# Patient Record
Sex: Male | Born: 1985 | Race: Black or African American | Hispanic: No | Marital: Married | Smoking: Current some day smoker
Health system: Southern US, Community
[De-identification: ages and names within clinical notes are randomized; demographics above are authoritative.]

## PROBLEM LIST (undated history)

## (undated) DIAGNOSIS — S62109A Fracture of unspecified carpal bone, unspecified wrist, initial encounter for closed fracture: Secondary | ICD-10-CM

## (undated) DIAGNOSIS — F1010 Alcohol abuse, uncomplicated: Secondary | ICD-10-CM

---

## 2011-05-30 ENCOUNTER — Emergency Department: Payer: Self-pay | Admitting: Emergency Medicine

## 2012-08-21 ENCOUNTER — Emergency Department: Payer: Self-pay | Admitting: Emergency Medicine

## 2012-08-21 LAB — COMPREHENSIVE METABOLIC PANEL
Albumin: 4.4 g/dL (ref 3.4–5.0)
Alkaline Phosphatase: 66 U/L (ref 50–136)
Anion Gap: 7 (ref 7–16)
BUN: 7 mg/dL (ref 7–18)
Bilirubin,Total: 0.4 mg/dL (ref 0.2–1.0)
Co2: 24 mmol/L (ref 21–32)
Creatinine: 0.9 mg/dL (ref 0.60–1.30)
EGFR (African American): >60
EGFR (Non-African Amer.): >60
Glucose: 101 mg/dL — ABNORMAL HIGH (ref 65–99)
Osmolality: 274 (ref 275–301)
Potassium: 3.7 mmol/L (ref 3.5–5.1)
SGOT(AST): 42 U/L — ABNORMAL HIGH (ref 15–37)
SGPT (ALT): 28 U/L (ref 12–78)
Total Protein: 8.2 g/dL (ref 6.4–8.2)

## 2012-08-21 LAB — LIPASE, BLOOD: Lipase: 627 U/L — ABNORMAL HIGH (ref 73–393)

## 2012-08-21 LAB — ETHANOL: Ethanol: 193 mg/dL

## 2012-08-21 LAB — URINALYSIS, COMPLETE
Bacteria: NONE SEEN
Bilirubin,UR: NEGATIVE
Glucose,UR: NEGATIVE mg/dL (ref 0–75)
Ketone: NEGATIVE
Leukocyte Esterase: NEGATIVE
Ph: 6 (ref 4.5–8.0)
RBC,UR: NONE SEEN /HPF (ref 0–5)
WBC UR: NONE SEEN /HPF (ref 0–5)

## 2012-08-21 LAB — CBC
MCH: 30.1 pg (ref 26.0–34.0)
Platelet: 237 10*3/uL (ref 150–440)
RBC: 4.92 10*6/uL (ref 4.40–5.90)

## 2012-08-27 ENCOUNTER — Emergency Department: Payer: Self-pay | Admitting: Internal Medicine

## 2012-08-27 LAB — URINALYSIS, COMPLETE
Bilirubin,UR: NEGATIVE
Ketone: NEGATIVE
Ph: 6 (ref 4.5–8.0)
Specific Gravity: 1.008 (ref 1.003–1.030)
Squamous Epithelial: 1
WBC UR: <1 /HPF (ref 0–5)

## 2012-08-27 LAB — COMPREHENSIVE METABOLIC PANEL
Albumin: 4.5 g/dL (ref 3.4–5.0)
Anion Gap: 10 (ref 7–16)
BUN: 7 mg/dL (ref 7–18)
Calcium, Total: 9 mg/dL (ref 8.5–10.1)
Chloride: 105 mmol/L (ref 98–107)
EGFR (Non-African Amer.): >60
Glucose: 82 mg/dL (ref 65–99)
Osmolality: 273 (ref 275–301)
Potassium: 4 mmol/L (ref 3.5–5.1)
SGOT(AST): 53 U/L — ABNORMAL HIGH (ref 15–37)
Total Protein: 8.4 g/dL — ABNORMAL HIGH (ref 6.4–8.2)

## 2012-08-27 LAB — CBC
HGB: 14.8 g/dL (ref 13.0–18.0)
MCHC: 34.1 g/dL (ref 32.0–36.0)

## 2012-08-27 LAB — ETHANOL
Ethanol %: 0.151 % — ABNORMAL HIGH (ref 0.000–0.080)
Ethanol: 151 mg/dL

## 2012-08-27 LAB — LIPASE, BLOOD: Lipase: 107 U/L (ref 73–393)

## 2014-09-16 ENCOUNTER — Emergency Department (HOSPITAL_COMMUNITY): Payer: Self-pay

## 2014-09-16 ENCOUNTER — Emergency Department (HOSPITAL_COMMUNITY)
Admission: EM | Admit: 2014-09-16 | Discharge: 2014-09-16 | Disposition: A | Discharge status: Home / Self Care | Payer: Self-pay | Attending: Emergency Medicine | Admitting: Emergency Medicine

## 2014-09-16 ENCOUNTER — Encounter (HOSPITAL_COMMUNITY): Payer: Self-pay | Admitting: Emergency Medicine

## 2014-09-16 DIAGNOSIS — F1010 Alcohol abuse, uncomplicated: Secondary | ICD-10-CM | POA: Insufficient documentation

## 2014-09-16 DIAGNOSIS — R569 Unspecified convulsions: Secondary | ICD-10-CM | POA: Insufficient documentation

## 2014-09-16 LAB — COMPREHENSIVE METABOLIC PANEL
ALBUMIN: 4 g/dL (ref 3.5–5.0)
ALK PHOS: 56 U/L (ref 38–126)
ALT: 55 U/L (ref 17–63)
AST: 83 U/L — ABNORMAL HIGH (ref 15–41)
Anion gap: 16 — ABNORMAL HIGH (ref 5–15)
BILIRUBIN TOTAL: 0.8 mg/dL (ref 0.3–1.2)
BUN: 9 mg/dL (ref 6–20)
CHLORIDE: 95 mmol/L — AB (ref 101–111)
CO2: 19 mmol/L — ABNORMAL LOW (ref 22–32)
CREATININE: 1.08 mg/dL (ref 0.61–1.24)
Calcium: 8.9 mg/dL (ref 8.9–10.3)
GFR calc non Af Amer: >60 mL/min (ref >60)
Glucose, Bld: 81 mg/dL (ref 65–99)
POTASSIUM: 4.2 mmol/L (ref 3.5–5.1)
Sodium: 130 mmol/L — ABNORMAL LOW (ref 135–145)
TOTAL PROTEIN: 7.2 g/dL (ref 6.5–8.1)

## 2014-09-16 LAB — CBC WITH DIFFERENTIAL/PLATELET
Basophils Absolute: 0 10*3/uL (ref 0.0–0.1)
Basophils Relative: 0 % (ref 0–1)
Eosinophils Absolute: 0 10*3/uL (ref 0.0–0.7)
Eosinophils Relative: 0 % (ref 0–5)
HCT: 39.8 % (ref 39.0–52.0)
Hemoglobin: 13.7 g/dL (ref 13.0–17.0)
LYMPHS PCT: 13 % (ref 12–46)
Lymphs Abs: 0.8 10*3/uL (ref 0.7–4.0)
MCH: 29.5 pg (ref 26.0–34.0)
MCHC: 34.4 g/dL (ref 30.0–36.0)
MCV: 85.6 fL (ref 78.0–100.0)
Monocytes Absolute: 0.5 10*3/uL (ref 0.1–1.0)
Monocytes Relative: 9 % (ref 3–12)
NEUTROS ABS: 4.7 10*3/uL (ref 1.7–7.7)
Neutrophils Relative %: 78 % — ABNORMAL HIGH (ref 43–77)
PLATELETS: 188 10*3/uL (ref 150–400)
RBC: 4.65 MIL/uL (ref 4.22–5.81)
RDW: 15.1 % (ref 11.5–15.5)
WBC: 6.1 10*3/uL (ref 4.0–10.5)

## 2014-09-16 LAB — ETHANOL: Alcohol, Ethyl (B): <5 mg/dL (ref <5)

## 2014-09-16 NOTE — ED Provider Notes (Signed)
Patient's head CT was negative for acute pathology. Speaking with the patient he states he's been drinking heavily daily for over a year but does not want to stop drinking. Did discuss with him that because he did not drink yesterday or today the seizure may have been precipitated by that. However patient states that he plans to drink when he leaves in his not interested in stopping. However he was given resources for alcohol detox and AA. Also patient given neurology follow-up.  He was unable to urinate here however states he smokes marijuana occasionally but does not use any other street drugs and does not take benzodiazepines.  Gwyneth SproutWhitney Avraham Benish, MD 09/16/14 1655

## 2014-09-16 NOTE — Discharge Instructions (Signed)
Seizure, Adult °A seizure is abnormal electrical activity in the brain. Seizures usually last from 30 seconds to 2 minutes. There are various types of seizures. °Before a seizure, you may have a warning sensation (aura) that a seizure is about to occur. An aura may include the following symptoms:  °· Fear or anxiety. °· Nausea. °· Feeling like the room is spinning (vertigo). °· Vision changes, such as seeing flashing lights or spots. °Common symptoms during a seizure include: °· A change in attention or behavior (altered mental status). °· Convulsions with rhythmic jerking movements. °· Drooling. °· Rapid eye movements. °· Grunting. °· Loss of bladder and bowel control. °· Bitter taste in the mouth. °· Tongue biting. °After a seizure, you may feel confused and sleepy. You may also have an injury resulting from convulsions during the seizure. °HOME CARE INSTRUCTIONS  °· If you are given medicines, take them exactly as prescribed by your health care provider. °· Keep all follow-up appointments as directed by your health care provider. °· Do not swim or drive or engage in risky activity during which a seizure could cause further injury to you or others until your health care provider says it is OK. °· Get adequate rest. °· Teach friends and family what to do if you have a seizure. They should: °¨ Lay you on the ground to prevent a fall. °¨ Put a cushion under your head. °¨ Loosen any tight clothing around your neck. °¨ Turn you on your side. If vomiting occurs, this helps keep your airway clear. °¨ Stay with you until you recover. °¨ Know whether or not you need emergency care. °SEEK IMMEDIATE MEDICAL CARE IF: °· The seizure lasts longer than 5 minutes. °· The seizure is severe or you do not wake up immediately after the seizure. °· You have an altered mental status after the seizure. °· You are having more frequent or worsening seizures. °Someone should drive you to the emergency department or call local emergency  services (911 in [place] °MAKE SURE YOU: °· Understand these instructions. °· Will watch your condition. °· Will get help right away if you are not doing well or get worse. °Document Released: 04/13/2000 Document Revised: 02/04/2013 Document Reviewed: 11/26/2012 °ExitCare® Patient Information ©2015 [employer]. This information is not intended to replace advice given to you by your health care provider. Make sure you discuss any questions you have with your health care provider. ° °Driving and Equipment Restrictions °Some medical problems make it dangerous to drive, ride a bike, or use machines. Some of these problems are: °· A hard blow to the head (concussion). °· Passing out (fainting). °· Twitching and shaking (seizures). °· Low blood sugar. °· Taking medicine to help you relax (sedatives). °· Taking pain medicines. °· Wearing an eye patch. °· Wearing splints. This can make it hard to use parts of your body that you need to drive safely. °HOME CARE  °· Do not drive until your doctor says it is okay. °· Do not use machines until your doctor says it is okay. °You may need a form signed by your doctor (medical release) before you can drive again. You may also need this form before you do other tasks where you need to be fully alert. °MAKE SURE YOU: °· Understand these instructions. °· Will watch your condition. °· Will get help right away if you are not doing well or get worse. °Document Released: 05/24/2004 Document Revised: 07/09/2011 Document Reviewed: 08/24/2009 °ExitCare® Patient Information ©2015 [employer].   This information is not intended to replace advice given to you by your health care provider. Make sure you discuss any questions you have with your health care provider. ° ° °Emergency Department Resource Guide °1) Find a Doctor and Pay Out of Pocket °Although you won't have to find out who is covered by your insurance plan, it is a good idea to ask around and get recommendations. You will then need  to call the office and see if the doctor you have chosen will accept you as a new patient and what types of options they offer for patients who are self-pay. Some doctors offer discounts or will set up payment plans for their patients who do not have insurance, but you will need to ask so you aren't surprised when you get to your appointment. ° °2) Contact Your Local Health Department °Not all health departments have doctors that can see patients for sick visits, but many do, so it is worth a call to see if yours does. If you don't know where your local health department is, you can check in your phone book. The CDC also has a tool to help you locate your state's health department, and many state websites also have listings of all of their local health departments. ° °3) Find a Walk-in Clinic °If your illness is not likely to be very severe or complicated, you may want to try a walk in clinic. These are popping up all over the country in pharmacies, drugstores, and shopping centers. They're usually staffed by nurse practitioners or physician assistants that have been trained to treat common illnesses and complaints. They're usually fairly quick and inexpensive. However, if you have serious medical issues or chronic medical problems, these are probably not your best option. ° °No Primary Care Doctor: °- Call [facility] at  832-8000 - they can help you locate a primary care doctor that  accepts your insurance, provides certain services, etc. °- Physician Referral Service- 1-800-533-3463 ° °Chronic Pain Problems: °Organization         Address  Phone   Notes  °[facility]  (336) 297-2271 Patients need to be referred by their primary care doctor.  ° °Medication Assistance: °Organization         Address  Phone   Notes  °[place] [facility] [address] °[place], [place] [address] (336) 641-8030 --Must be a resident of [place] °-- Must have NO insurance  coverage whatsoever (no Medicaid/ Medicare, etc.) °-- The pt. MUST have a primary care doctor that directs their care regularly and follows them in the community °  °[facility]  (866) 331-1348   °[employer]  (888) 892-1162   ° °Agencies that provide inexpensive medical care: °Organization         Address  Phone   Notes  °[facility]  (336) 832-8035   °[facility]    (336) 832-7272   °[facility] [address] °[place], [place] [address] (336) 832-4777   °[facility] [address], °[place] (336) 271-4999   °[facility]    (336) 373-0678   °[facility]    (336) 272-1050   °[facility] ° [address], [place] Phone:  (336) 832-4444, Fax:  (336) 832-4440 Hours of Operation:  9 am - 6 pm, M-F.  Also accepts Medicaid/Medicare and self-pay.  °[facility] ° [address], [place] Phone: (336) 832-3150, Fax: (336) 832-3151. Hours of Operation:  8:30 am - 5:30 pm, M-F.  Also accepts Medicaid and self-pay.  °[facility] [place] [address], [place] Phone: (336) 878-6027   °[facility] [address], [place], [place] (336)723-1848, Ext. 123 Mondays & Thursdays: 7-9 AM.  First 15 patients are seen on a first come, first serve basis. °  ° °Medicaid-accepting [place] Providers: ° °Organization         Address  Phone   Notes  °[facility] [address], [place] (336) 641-2100 Also accepts self-pay patients.  °[facility] [address], [place] ° (336) 856-9996   °[facility] [address], [place] (336) 288-8857   °[facility] [address] [place] [address], [place] (336) 299-7000   °Veita Bland [address], [place]  ° (336) 373-1557 Only accepts [place] Access Medicaid patients after they have their  name applied to their card.  ° °Self-Pay (no insurance) in [place]: ° °Organization         Address  Phone   Notes  °Sickle Cell Patients, [facility] [address], [place] (336) 832-1970   °[facility] [address], [place] (336) 832-4400   °[facility] ° [address], [place] (336) 992-4800   °[facility]/Dr. Osei-Bonsu ° [address] [place] [address], [place] or [address], [place] (336) 841-8500 Phone number for both [place] and [place] locations is the same.  °[facility] [address], [place] (336) 299-0000   °[facility] [address] [place] [address], [place] or [address] (336) 852-7530 °(336) 878-2260   °[facility] [address], [place] (336) 350-1642, phone; (336) 294-5005, fax Sees patients 1st and 3rd Saturday of every month.  Must not qualify for public or private insurance (i.e. Medicaid, Medicare, [place] Health Choice, Veterans' Benefits) • Household income should be no more than 200% of the poverty level •The clinic cannot treat you if you are pregnant or think you are pregnant • Sexually transmitted diseases are not treated at the clinic.  ° ° °Dental Care: °Organization         Address  Phone  Notes  °[facility] [address], [place] (336) 641-6152 Accepts children up to age 21 who are enrolled in Medicaid or [place] Health Choice; pregnant women with a Medicaid card; and children who have applied for Medicaid or [place] Health Choice, but were declined, whose parents can pay a reduced fee at time of service.  °[facility] [place]  [address], [place] (336) 641-7733 Accepts children up to age 21 who are enrolled in Medicaid or [place] Health Choice; pregnant women with a Medicaid card; and children who have applied for  Medicaid or [place] Health Choice, but were declined, whose parents can pay a reduced fee at time of service.  °[facility] ° [address], [place] (336) 641-4533 Patients are seen by appointment only. Walk-ins are not accepted. [facility] will see patients 18 years of age and older. °Monday - Tuesday (8am-5pm) °Most Wednesdays (8:30-5pm) °$30 per visit, cash only  °[facility] ° [address], [place] (336) 641-4533 Patients are seen by appointment only. Walk-ins are not accepted. [facility] will see patients 18 years of age and older. °One Wednesday Evening (Monthly: Volunteer Based).  $30 per visit, cash only  °[facility]  (919) 537-3737 for adults; Children under age 4, call Graduate Pediatric Dentistry at (919) [phone]. Children aged 4-14, please call (919) [phone] to request a pediatric application. ° Dental services are provided in all areas of dental care including fillings, crowns and bridges, complete and partial dentures, implants, gum treatment, root canals, and extractions. Preventive care is also provided. Treatment is provided to both adults and children. °Patients are selected via a lottery and there is often a waiting list. °  °[facility] [address], °[place] ° (336) 763-8833 [email or web address] °  °[facility] [address], [place], [place] (336)723-1848, Ext. 123 Second and Fourth Thursday of each month, opens at 6:30 AM; Clinic ends at 9 AM.  Patients are seen on a first-come first-served basis, and a limited number are seen during each clinic.  ° °[facility] ° [address], [place], [place] (336) 723-7904   Eligibility Requirements °You must have lived in [place], [place], or [place] counties for at least the last three months. °  You cannot be eligible for state or federal sponsored healthcare insurance, including Veterans Administration, Medicaid,  or Medicare. °  You generally cannot be eligible for healthcare insurance through your employer.  °  How to apply: °Eligibility screenings are held every Tuesday and Wednesday afternoon from 1:00 pm until 4:00 pm. You do not need an appointment for the interview!  °[facility] [address], [place], [place] 336-631-2330   °[facility]  336-342-8273   °[facility]  336-703-3100   °[facility]  336-570-6415   ° °Behavioral Health Resources in the Community: °Intensive Outpatient Programs °Organization         Address  Phone  Notes  °[facility] [address], [place], [place] 336-878-6098   °[facility] [address], [place], [place] 336-832-9800   °[facility] [address], [place], [place] ° 336-882-2125   °[facility] [address],  °[place], [place] 1-800-853-5163 or 336-641-4981   °Substance Abuse Resources °Organization         Address  Phone  Notes  °[facility]  336-882-2125   °[facility]  336-784-9470   °[facility]  336-285-9073   °[facility]  336-845-3988   °Residential & Outpatient Substance Abuse Program  1-800-659-3381   °Psychological Services °Organization         Address  Phone  Notes  °[facility]  336- 832-9600   °[facility]  336- 378-7881   °[facility] [address], [place] 1-800-853-5163 or 336-641-4981   ° °Mobile Crisis Teams °Organization         Address  Phone  Notes  °[facility], Mobile Crisis Care Unit  1-877-626-1772   °Assertive °[facility] ° [address] [place], [place] 336-834-9664   °Sharon DeEsch [address] °[place] 336-554-5454   ° °Self-Help/Support Groups °Organization         Address  Phone             Notes  °[facility]   [place] - variety of support groups   336- 373-1402 Call for more information  °Narcotics Anonymous (NA), [facility] [address], °[place]  2 meetings at this location  ° °Residential Treatment Programs °Organization         Address  Phone  Notes  °[facility] [address],    °[place]  1-866-801-8205   °[facility] ° [address], [place], [place] 704-293-8524   °[facility] [address], [place] 336-845-3988 Admissions: 8am-3pm M-F  °[facility] [address]    °[place], [place] 336-841-1104   °[facility] [address], [place], [place] 336-379-7146   °[facility] [address]  °[place], [place] 336-285-9073   °[facility] - Intensive Outpatient [address], [place], [place] 336-852-3033   °[facility] [address]  °[place], [place] 1-877-615-2722 or 336-784-9470   °[facility] [address] [place], [place] 336-227-7417 Accepts Medicaid  °[facility] [address]  °[place] 1-800-659-3381 Substance Abuse/Addiction Treatment  ° °[place] Behavioral Health Resources °Organization         Address  Phone  Notes  °[facility]  (888) 581-9988   °Julie Brannon, PhD [address] [place], [place]   (336) 349-5553 or (336) 951-0000   °[facility]   [address] °[place], [place] (336) 349-4454   °[facility] [address], [place], [place] (336) 342-8316 Insurance/Medicaid/sponsorship through [facility]  °[facility] [address]                                    [place], [place] (336) 342-8316 Therapy/tele-psych/case  °[facility] [address]  ° [place], [place] (336) 349-2233    °Dr. Arfeen  (336) 349-4544   °[facility] [place]  [employer] [place] [facility] 1) [address],  °2) [address], [place] °3)  [address], [place] (336) 349-3220 °(336) 342-7768 ° °(336) 342-8140   °[place] [facility] (336) 342-1394 or (336) 342-3537 (After Hours)    ° ° ° °

## 2014-09-16 NOTE — ED Provider Notes (Signed)
CSN: [id number]     Arrival date & time 09/16/14  1245 History   First MD Initiated Contact with Patient 09/16/14 1406     Chief Complaint  Patient presents with  . Seizures     (Consider location/radiation/quality/duration/timing/severity/associated sxs/prior Treatment) Patient is a 29 y.o. male presenting with seizures. The history is provided by the patient.  Seizures Seizure activity on arrival: no    patient presents with a new onset seizure. Was inside the house and then went outside to bring her shirt to one of his friends and he had a tonic-clonic seizure. Reportedly last 6 minutes. Witnessed by wife who has a seizure disorder herself. Patient was confused after. Patient is feeling better now but does ache all over. No headache. Patient does reportedly drink alcohol heavily. He did not drink yesterday. No previous history of seizures. He denies substance abuse.  No past medical history on file. No past surgical history on file. No family history on file. History  Substance Use Topics  . Smoking status: Not on file  . Smokeless tobacco: Not on file  . Alcohol Use: Not on file    Review of Systems  Constitutional: Negative for activity change and appetite change.  Eyes: Negative for pain.  Respiratory: Negative for chest tightness and shortness of breath.   Cardiovascular: Negative for chest pain and leg swelling.  Gastrointestinal: Negative for nausea, vomiting, abdominal pain and diarrhea.  Genitourinary: Negative for flank pain.  Musculoskeletal: Negative for back pain and neck stiffness.  Skin: Negative for rash.  Neurological: Positive for seizures. Negative for weakness, numbness and headaches.  Psychiatric/Behavioral: Negative for behavioral problems.      Allergies  Review of patient's allergies indicates no known allergies.  Home Medications   Prior to Admission medications   Not on File   BP 138/64 mmHg  Pulse 80  Temp(Src) 98.3 F (36.8 C) (Oral)   Resp 18  SpO2 97% Physical Exam  Constitutional: He is oriented to person, place, and time. He appears well-developed and well-nourished.  HENT:  Head: Normocephalic and atraumatic.  Eyes: Pupils are equal, round, and reactive to light.  Neck: Normal range of motion. Neck supple.  Cardiovascular: Normal rate, regular rhythm and normal heart sounds.   No murmur heard. Pulmonary/Chest: Effort normal and breath sounds normal.  Abdominal: Soft. There is no tenderness.  Musculoskeletal: Normal range of motion. He exhibits no edema.  Neurological: He is alert and oriented to person, place, and time. No cranial nerve deficit.  Skin: Skin is warm and dry.  Psychiatric: He has a normal mood and affect.  Nursing note and vitals reviewed.   ED Course  Procedures (including critical care time) Labs Review Labs Reviewed  COMPREHENSIVE METABOLIC PANEL - Abnormal; Notable for the following:    Sodium 130 (*)    Chloride 95 (*)    CO2 19 (*)    AST 83 (*)    Anion gap 16 (*)    All other components within normal limits  CBC WITH DIFFERENTIAL/PLATELET - Abnormal; Notable for the following:    Neutrophils Relative % 78 (*)    All other components within normal limits  ETHANOL  URINE RAPID DRUG SCREEN (HOSP PERFORMED)    Imaging Review Dg Chest 2 View  09/16/2014   CLINICAL DATA:  Seizures. Cough. Short of breath. Chest pain. Symptoms for 3 weeks.  EXAM: CHEST  2 VIEW  COMPARISON:  None.  FINDINGS: Cardiopericardial silhouette within normal limits. Mediastinal contours normal. Trachea midline.  No airspace disease or effusion.  IMPRESSION: No active cardiopulmonary disease.   Electronically Signed   By: Andreas NewportGeoffrey  Lamke M.D.   On: 09/16/2014 15:15     EKG Interpretation None      MDM   Final diagnoses:  Seizure    Patient with new onset seizure. Back at baseline. Some lab work still pending. Head CT pending. Will sign out to Dr. Ramond DialPlunket    Earlie Arciga, MD 09/16/14 [phone]

## 2014-09-16 NOTE — ED Notes (Signed)
Per ems, pt w/witnessed tonic/clonic seizure lasting approx per bystanders.  Pt initially post-ictal, hypertensive and tachycardic, now awake, alert, oriented w/stable vitals.  Pt w/no hx of seizures

## 2016-06-04 ENCOUNTER — Encounter (HOSPITAL_COMMUNITY): Payer: Self-pay | Admitting: Emergency Medicine

## 2016-06-04 ENCOUNTER — Emergency Department (HOSPITAL_COMMUNITY): Payer: Self-pay

## 2016-06-04 ENCOUNTER — Emergency Department (HOSPITAL_COMMUNITY)
Admission: EM | Admit: 2016-06-04 | Discharge: 2016-06-04 | Disposition: A | Discharge status: Home / Self Care | Payer: Self-pay | Attending: Emergency Medicine | Admitting: Emergency Medicine

## 2016-06-04 DIAGNOSIS — F17210 Nicotine dependence, cigarettes, uncomplicated: Secondary | ICD-10-CM | POA: Insufficient documentation

## 2016-06-04 DIAGNOSIS — Y999 Unspecified external cause status: Secondary | ICD-10-CM | POA: Insufficient documentation

## 2016-06-04 DIAGNOSIS — S8392XA Sprain of unspecified site of left knee, initial encounter: Secondary | ICD-10-CM | POA: Insufficient documentation

## 2016-06-04 DIAGNOSIS — Y9389 Activity, other specified: Secondary | ICD-10-CM | POA: Insufficient documentation

## 2016-06-04 DIAGNOSIS — Y929 Unspecified place or not applicable: Secondary | ICD-10-CM | POA: Insufficient documentation

## 2016-06-04 DIAGNOSIS — W19XXXA Unspecified fall, initial encounter: Secondary | ICD-10-CM | POA: Insufficient documentation

## 2016-06-04 DIAGNOSIS — M25562 Pain in left knee: Secondary | ICD-10-CM

## 2016-06-04 NOTE — ED Notes (Signed)
Patient transported to X-ray 

## 2016-06-04 NOTE — ED Provider Notes (Signed)
[facility]-EMERGENCY DEPT Provider Note   CSN: [id number] Arrival date & time: 06/04/16  0903  By signing my name below, I, Brett Bradley, attest that this documentation has been prepared under the direction and in the presence of Wells FargoKelly Traeger Sultana, PA-C. Electronically Signed: Sonum Bradley, Neurosurgeoncribe. 06/04/16. 10:35 AM.  History   Chief Complaint Chief Complaint  Patient presents with  . Knee Pain    The history is provided by the patient. No language interpreter was used.    HPI Comments: Brett Bradley is a 31 y.o. male who presents to the Emergency Department complaining of constant, unchanged left knee pain that began 2 days ago after a fall. He reports running after his kids when he slipped and fell causing his knee to make a popping noise. He reports swelling initially but has since gradually improved. He has tried ibuprofen and Tylenol with some relief. He reports having instability when walking, states it feels as though his leg might buckle. He denies numbness or weakness.   History reviewed. No pertinent past medical history.  There are no active problems to display for this patient.   History reviewed. No pertinent surgical history.     Home Medications    Prior to Admission medications   Not on File    Family History History reviewed. No pertinent family history.  Social History Social History  Substance Use Topics  . Smoking status: Current Every Day Smoker    Packs/day: 0.50    Types: Cigarettes  . Smokeless tobacco: Never Used  . Alcohol use Yes     Comment: daily     Allergies   Patient has no known allergies.   Review of Systems Review of Systems  Musculoskeletal: Positive for arthralgias and joint swelling.  Neurological: Negative for weakness and numbness.     Physical Exam Updated Vital Signs BP 137/87 (BP Location: Right Arm)   Pulse 77   Temp 98.5 F (36.9 C) (Oral)   Resp 18   Ht 5\' 11"  (1.803 m)   Wt 199 lb (90.3 kg)   SpO2 97%   BMI 27.75  kg/m   Physical Exam  Constitutional: He is oriented to person, place, and time. He appears well-developed and well-nourished.  HENT:  Head: Normocephalic and atraumatic.  Cardiovascular: Normal rate.   Pulmonary/Chest: Effort normal.  Musculoskeletal: He exhibits edema and tenderness. He exhibits no deformity.  Left knee: Moderate amount of swelling with tenderness on the medial knee. Anterior drawer test negative. Mild laxity with valgus stress test. No laxity with verus stress test. Decreased ROM.  Neurological: He is alert and oriented to person, place, and time.  Skin: Skin is warm and dry.  Psychiatric: He has a normal mood and affect.  Nursing note and vitals reviewed.    ED Treatments / Results  DIAGNOSTIC STUDIES: Oxygen Saturation is 97% on RA, normal by my interpretation.    COORDINATION OF CARE: 10:30 AM Discussed treatment plan with pt at bedside and pt agreed to plan.   Labs (all labs ordered are listed, but only abnormal results are displayed) Labs Reviewed - No data to display  EKG  EKG Interpretation None       Radiology Dg Knee Complete 4 Views Left  Result Date: 06/04/2016 CLINICAL DATA:  Twisting injury last week. Heard pop. Medial knee pain. EXAM: LEFT KNEE - COMPLETE 4+ VIEW COMPARISON:  None. FINDINGS: Trace joint effusion. No acute bony abnormality. Specifically, no fracture, subluxation, or dislocation. Soft tissues are intact. IMPRESSION: No  acute bony abnormality.  Trace joint effusion. Electronically Signed   By: Charlett Nose M.D.   On: 06/04/2016 10:13    Procedures Procedures (including critical care time)  Medications Ordered in ED Medications - No data to display   Initial Impression / Assessment and Plan / ED Course  I have reviewed the triage vital signs and the nursing notes.  Pertinent labs & imaging results that were available during my care of the patient were reviewed by me and considered in my medical decision making (see  chart for details).  31 year old male presents with symptoms consistent with knee sprain. Patient X-Ray negative for obvious fracture or dislocation.  Pt advised to follow up with orthopedics. Patient given knee immobilizer and crutches while in ED, conservative therapy recommended and discussed. Patient will be discharged home & is agreeable with above plan. Returns precautions discussed. Pt appears safe for discharge.   Final Clinical Impressions(s) / ED Diagnoses   Final diagnoses:  Acute pain of left knee  Sprain of left knee, unspecified ligament, initial encounter    New Prescriptions New Prescriptions   No medications on file   I personally performed the services described in this documentation, which was scribed in my presence. The recorded information has been reviewed and is accurate.    Bethel Born, PA-C 06/05/16 1926    Rolland Porter, MD 06/17/16 [phone]

## 2016-06-04 NOTE — Discharge Instructions (Signed)
Rest - please stay off knee as much as possible until you can put weight on it without difficulty Ice - ice for 20 minutes at a time, several times a day Compression - wear brace to provide support Elevate - elevate knee above level of heart to reduce swelling Ibuprofen - take with food. Take up to 3-4 times daily Follow up with Orthopedics

## 2016-06-04 NOTE — Progress Notes (Signed)
Orthopedic Tech Progress Note Patient Details:  Brett JewsMario A Bradley 10-18-1985 [id number]  Ortho Devices Type of Ortho Device: Crutches, Knee Immobilizer Ortho Device/Splint Interventions: Application   Saul FordyceJennifer C Erin Obando 06/04/2016, 11:17 AM

## 2016-06-04 NOTE — ED Triage Notes (Signed)
Pt states he was playing with his kids and hurt his left knee on Saturday. Pt states he heard a "pop" and it has been swollen. Pt is ambulatory.

## 2016-06-04 NOTE — ED Notes (Signed)
Pt. Was playing with his kids last Saturday and felt his lt. Knee pop.   No visible swelling or deformity noted.  Pt. Reports that the pain to the knee is increasing.

## 2016-08-09 ENCOUNTER — Emergency Department (HOSPITAL_COMMUNITY)
Admission: EM | Admit: 2016-08-09 | Discharge: 2016-08-09 | Disposition: A | Discharge status: Home / Self Care | Payer: Self-pay | Attending: Emergency Medicine | Admitting: Emergency Medicine

## 2016-08-09 ENCOUNTER — Encounter (HOSPITAL_COMMUNITY): Payer: Self-pay | Admitting: Emergency Medicine

## 2016-08-09 DIAGNOSIS — S39012A Strain of muscle, fascia and tendon of lower back, initial encounter: Secondary | ICD-10-CM | POA: Insufficient documentation

## 2016-08-09 DIAGNOSIS — Y939 Activity, unspecified: Secondary | ICD-10-CM | POA: Insufficient documentation

## 2016-08-09 DIAGNOSIS — F17210 Nicotine dependence, cigarettes, uncomplicated: Secondary | ICD-10-CM | POA: Insufficient documentation

## 2016-08-09 DIAGNOSIS — Y999 Unspecified external cause status: Secondary | ICD-10-CM | POA: Insufficient documentation

## 2016-08-09 DIAGNOSIS — Y929 Unspecified place or not applicable: Secondary | ICD-10-CM | POA: Insufficient documentation

## 2016-08-09 DIAGNOSIS — X58XXXA Exposure to other specified factors, initial encounter: Secondary | ICD-10-CM | POA: Insufficient documentation

## 2016-08-09 MED ORDER — IBUPROFEN 600 MG PO TABS: 600.0000 mg | ORAL_TABLET | Freq: Four times a day (QID) | ORAL | 0 refills | Status: DC | PRN

## 2016-08-09 MED ORDER — CYCLOBENZAPRINE HCL 10 MG PO TABS: 10.0000 mg | ORAL_TABLET | Freq: Two times a day (BID) | ORAL | 0 refills | Status: DC | PRN

## 2016-08-09 NOTE — ED Triage Notes (Signed)
Pt states his back has been hurting for several weeks. He plays with his kids and isn't sure if he messed his back up. Pt states it hurts worse when he sneezes.

## 2016-08-09 NOTE — ED Provider Notes (Signed)
[facility]-EMER[facility]Y DEPT Provider Note   CSN: [id number] Arrival date & time: 08/09/16  1020   By signing my name below, I, Bobbie Stack, attest that this documentation has been prepared under the direction and in the presence of Fayrene Helper, PA-C. Electronically Signed: Bobbie Stack, Scribe. 08/09/16. 11:51 AM. History   Chief Complaint Chief Complaint  Patient presents with  . Back Pain    The history is provided by the patient. No language interpreter was used.  HPI Comments: Brett Bradley is a 31 y.o. male who presents to the Emergency Department complaining of sharp lower back pain for the past several weeks. He reports worsened pain when he coughs, sneezes, or any type of movement. The patient states that he drinks beer and smokes weed to alleviate his pain. He states that he has been playing football recently with his kids and is unsure if strained anything while playing with them. The patient doesn't have a doctor currently. He denies numbness, urinary symptoms, or bowel incontinence. No fever, or rash.  No hx of IVDU or active cancer.   History reviewed. No pertinent past medical history.  There are no active problems to display for this patient.   History reviewed. No pertinent surgical history.     Home Medications    Prior to Admission medications   Medication Sig Start Date End Date Taking? Authorizing Provider  cyclobenzaprine (FLEXERIL) 10 MG tablet Take 1 tablet (10 mg total) by mouth 2 (two) times daily as needed for muscle spasms. 08/09/16   Fayrene Helper, PA-C  ibuprofen (ADVIL,MOTRIN) 600 MG tablet Take 1 tablet (600 mg total) by mouth every 6 (six) hours as needed. 08/09/16   Fayrene Helper, PA-C    Family History History reviewed. No pertinent family history.  Social History Social History  Substance Use Topics  . Smoking status: Current Every Day Smoker    Packs/day: 0.50    Types: Cigarettes  . Smokeless tobacco: Never Used  . Alcohol use Yes   Comment: daily     Allergies   Patient has no known allergies.   Review of Systems Review of Systems  Constitutional: Negative for fever.  Genitourinary: Negative for enuresis and frequency.  Musculoskeletal: Positive for back pain.  Neurological: Negative for numbness.    Physical Exam Updated Vital Signs BP (!) 143/99 (BP Location: Left Arm)   Pulse 73   Temp 98.4 F (36.9 C) (Oral)   Resp 17   SpO2 98%   Physical Exam  Constitutional: He is oriented to person, place, and time. He appears well-developed and well-nourished.  HENT:  Head: Normocephalic.  Eyes: EOM are normal.  Neck: Normal range of motion.  Cardiovascular: Normal rate and regular rhythm.   Pulmonary/Chest: Effort normal. No respiratory distress.  Abdominal: He exhibits no distension.  Musculoskeletal: Normal range of motion.  No significant midline tenderness. Tenderness to lumbar paraspinal musculature upon palpation. Tenderness along right trapezius muscle. No skin rashes. Normal grip strength.  Neurological: He is alert and oriented to person, place, and time.  Psychiatric: He has a normal mood and affect.  Nursing note and vitals reviewed.   ED Treatments / Results  DIAGNOSTIC STUDIES: Oxygen Saturation is 98% on RA, normal by my interpretation.    COORDINATION OF CARE: 11:39 AM Discussed treatment plan with pt at bedside and pt agreed to plan. I will give the patient some muscle relaxers.  Labs (all labs ordered are listed, but only abnormal results are displayed) Labs Reviewed - No data  to display  EKG  EKG Interpretation None       Radiology No results found.  Procedures Procedures (including critical care time)  Medications Ordered in ED Medications - No data to display   Initial Impression / Assessment and Plan / ED Course  I have reviewed the triage vital signs and the nursing notes.  Pertinent labs & imaging results that were available during my care of the patient  were reviewed by me and considered in my medical decision making (see chart for details).     BP 127/85 (BP Location: Right Arm)   Pulse 65   Temp 97.8 F (36.6 C) (Oral)   Resp 16   SpO2 98%    Final Clinical Impressions(s) / ED Diagnoses   Final diagnoses:  Low back strain, initial encounter    New Prescriptions New Prescriptions   CYCLOBENZAPRINE (FLEXERIL) 10 MG TABLET    Take 1 tablet (10 mg total) by mouth 2 (two) times daily as needed for muscle spasms.   IBUPROFEN (ADVIL,MOTRIN) 600 MG TABLET    Take 1 tablet (600 mg total) by mouth every 6 (six) hours as needed.   I personally performed the services described in this documentation, which was scribed in my presence. The recorded information has been reviewed and is accurate.   Pt with MSK pain.  No red flags.  Ambulate without difficulty. No sign of infection.  RICE therapy discussed.  Recommend cessation of alcohol and rec drug use.    Fayrene Helper, PA-C 08/09/16 1155    Laurence Spates, MD 08/09/16 [phone]

## 2016-11-10 ENCOUNTER — Emergency Department (HOSPITAL_COMMUNITY)
Admission: EM | Admit: 2016-11-10 | Discharge: 2016-11-10 | Disposition: A | Discharge status: Home / Self Care | Payer: Self-pay | Attending: Physician Assistant | Admitting: Physician Assistant

## 2016-11-10 ENCOUNTER — Encounter (HOSPITAL_COMMUNITY): Payer: Self-pay

## 2016-11-10 ENCOUNTER — Emergency Department (HOSPITAL_COMMUNITY): Payer: Self-pay

## 2016-11-10 DIAGNOSIS — F1010 Alcohol abuse, uncomplicated: Secondary | ICD-10-CM

## 2016-11-10 DIAGNOSIS — K921 Melena: Secondary | ICD-10-CM | POA: Insufficient documentation

## 2016-11-10 DIAGNOSIS — R1013 Epigastric pain: Secondary | ICD-10-CM | POA: Insufficient documentation

## 2016-11-10 DIAGNOSIS — Y907 Blood alcohol level of 200-239 mg/100 ml: Secondary | ICD-10-CM | POA: Insufficient documentation

## 2016-11-10 DIAGNOSIS — K2921 Alcoholic gastritis with bleeding: Secondary | ICD-10-CM | POA: Insufficient documentation

## 2016-11-10 DIAGNOSIS — F10120 Alcohol abuse with intoxication, uncomplicated: Secondary | ICD-10-CM | POA: Insufficient documentation

## 2016-11-10 DIAGNOSIS — R112 Nausea with vomiting, unspecified: Secondary | ICD-10-CM | POA: Insufficient documentation

## 2016-11-10 DIAGNOSIS — F17210 Nicotine dependence, cigarettes, uncomplicated: Secondary | ICD-10-CM | POA: Insufficient documentation

## 2016-11-10 DIAGNOSIS — R0789 Other chest pain: Secondary | ICD-10-CM | POA: Insufficient documentation

## 2016-11-10 HISTORY — DX: Alcohol abuse, uncomplicated: F10.10

## 2016-11-10 LAB — TYPE AND SCREEN
ABO/RH(D): O POS
ANTIBODY SCREEN: NEGATIVE

## 2016-11-10 LAB — CBC WITH DIFFERENTIAL/PLATELET
Basophils Absolute: 0 10*3/uL (ref 0.0–0.1)
Basophils Relative: 0 %
Eosinophils Absolute: 0 10*3/uL (ref 0.0–0.7)
Eosinophils Relative: 0 %
HEMATOCRIT: 40 % (ref 39.0–52.0)
Hemoglobin: 13.9 g/dL (ref 13.0–17.0)
LYMPHS ABS: 0.6 10*3/uL — AB (ref 0.7–4.0)
Lymphocytes Relative: 9 %
MCH: 29.8 pg (ref 26.0–34.0)
MCHC: 34.8 g/dL (ref 30.0–36.0)
MCV: 85.8 fL (ref 78.0–100.0)
Monocytes Absolute: 0.2 10*3/uL (ref 0.1–1.0)
Monocytes Relative: 4 %
NEUTROS ABS: 6 10*3/uL (ref 1.7–7.7)
Neutrophils Relative %: 87 %
Platelets: 208 10*3/uL (ref 150–400)
RBC: 4.66 MIL/uL (ref 4.22–5.81)
RDW: 14.4 % (ref 11.5–15.5)
WBC: 6.9 10*3/uL (ref 4.0–10.5)

## 2016-11-10 LAB — COMPREHENSIVE METABOLIC PANEL
ALT: 41 U/L (ref 17–63)
ANION GAP: 13 (ref 5–15)
AST: 82 U/L — AB (ref 15–41)
Albumin: 5.2 g/dL — ABNORMAL HIGH (ref 3.5–5.0)
Alkaline Phosphatase: 51 U/L (ref 38–126)
BILIRUBIN TOTAL: 0.7 mg/dL (ref 0.3–1.2)
BUN: 13 mg/dL (ref 6–20)
CO2: 23 mmol/L (ref 22–32)
Calcium: 9 mg/dL (ref 8.9–10.3)
Chloride: 103 mmol/L (ref 101–111)
Creatinine, Ser: 1.06 mg/dL (ref 0.61–1.24)
GFR calc Af Amer: >60 mL/min (ref >60)
GFR calc non Af Amer: >60 mL/min (ref >60)
Glucose, Bld: 87 mg/dL (ref 65–99)
Potassium: 4.2 mmol/L (ref 3.5–5.1)
SODIUM: 139 mmol/L (ref 135–145)
TOTAL PROTEIN: 9.3 g/dL — AB (ref 6.5–8.1)

## 2016-11-10 LAB — ETHANOL: Alcohol, Ethyl (B): 215 mg/dL — ABNORMAL HIGH (ref <5)

## 2016-11-10 LAB — I-STAT CG4 LACTIC ACID, ED
LACTIC ACID, VENOUS: 2.11 mmol/L — AB (ref 0.5–1.9)
Lactic Acid, Venous: 3.19 mmol/L (ref 0.5–1.9)

## 2016-11-10 LAB — AMMONIA: Ammonia: 15 umol/L (ref 9–35)

## 2016-11-10 LAB — I-STAT TROPONIN, ED: Troponin i, poc: 0.01 ng/mL (ref 0.00–0.08)

## 2016-11-10 MED ORDER — SODIUM CHLORIDE 0.9 % IV BOLUS (SEPSIS)
1000.0000 mL | Freq: Once | INTRAVENOUS | Status: AC
  Administered 2016-11-10: 1000 mL via INTRAVENOUS

## 2016-11-10 MED ORDER — PANTOPRAZOLE SODIUM 40 MG IV SOLR
40.0000 mg | Freq: Once | INTRAVENOUS | Status: AC
  Administered 2016-11-10: 40 mg via INTRAVENOUS
  Filled 2016-11-10: qty 40

## 2016-11-10 MED ORDER — SUCRALFATE 1 GM/10ML PO SUSP: 1.0000 g | Freq: Three times a day (TID) | ORAL | 0 refills | Status: DC

## 2016-11-10 MED ORDER — OMEPRAZOLE 20 MG PO CPDR: 20.0000 mg | DELAYED_RELEASE_CAPSULE | Freq: Every day | ORAL | 0 refills | Status: DC

## 2016-11-10 NOTE — ED Provider Notes (Signed)
[facility]-EMERGENCY DEPT Provider Note   CSN: [id number] Arrival date & time: 11/10/16  1734     History   Chief Complaint Chief Complaint  Patient presents with  . Hematemesis  . Alcohol Problem    HPI Brett Bradley is a 31 y.o. male.  HPI   Patient is 31 year old male who drinks daily. Patient brought in by wife. She reports that he's been increasingly angry at home. She reports that he's been vomiting and there's been some blood in his vomit. He reports blood in his stool as well. Patient says the stools are maroon color. He says it's bright red blood in his vomit. Patient ever had an EGD before. Reports being a daily drinker forever.He is complaining of epigastric pain currently.    Past Medical History:  Diagnosis Date  . Alcohol abuse     There are no active problems to display for this patient.   No past surgical history on file.     Home Medications    Prior to Admission medications   Medication Sig Start Date End Date Taking? Authorizing Provider  acetaminophen (TYLENOL) 500 MG tablet Take 1,000 mg by mouth every 6 (six) hours as needed for moderate pain or headache.   Yes [provider]  diphenhydrAMINE (BENADRYL) 25 MG tablet Take 50 mg by mouth daily as needed for allergies.   Yes [provider]  tiZANidine (ZANAFLEX) 4 MG tablet Take 12 mg by mouth daily as needed for muscle spasms.   Yes [provider]  cyclobenzaprine (FLEXERIL) 10 MG tablet Take 1 tablet (10 mg total) by mouth 2 (two) times daily as needed for muscle spasms. Patient not taking: Reported on 11/10/2016 08/09/16   Fayrene Helper, PA-C  ibuprofen (ADVIL,MOTRIN) 600 MG tablet Take 1 tablet (600 mg total) by mouth every 6 (six) hours as needed. Patient not taking: Reported on 11/10/2016 08/09/16   Fayrene Helper, PA-C    Family History No family history on file.  Social History Social History  Substance Use Topics  . Smoking status: Current Every Day Smoker   Packs/day: 0.50    Types: Cigarettes  . Smokeless tobacco: Never Used  . Alcohol use Yes     Comment: daily     Allergies   Patient has no known allergies.   Review of Systems Review of Systems  Constitutional: Negative for activity change and fever.  HENT: Negative for ear pain and sore throat.   Eyes: Negative for pain and visual disturbance.  Respiratory: Negative for cough and shortness of breath.   Cardiovascular: Negative for chest pain.  Gastrointestinal: Positive for abdominal pain, blood in stool, nausea and vomiting. Negative for diarrhea.  Genitourinary: Negative for dysuria.  Musculoskeletal: Negative for back pain and gait problem.  Neurological: Negative for dizziness.  Psychiatric/Behavioral: Positive for agitation.  All other systems reviewed and are negative.    Physical Exam Updated Vital Signs BP (!) 145/92   Pulse 81   Temp 98 F (36.7 C) (Oral)   Resp (!) 22   Ht 6' (1.829 m)   Wt 90.7 kg (200 lb)   SpO2 90%   BMI 27.12 kg/m   Physical Exam  Constitutional: He is oriented to person, place, and time. He appears well-nourished.  HENT:  Head: Normocephalic.  Eyes: Conjunctivae are normal. Right eye exhibits no discharge. Left eye exhibits no discharge.  Mild icterus.  Cardiovascular: Normal rate.   Pulmonary/Chest: Effort normal and breath sounds normal.  Abdominal: Soft. There is tenderness.  Epigastric pain.  Genitourinary: Rectum normal.  Genitourinary Comments: Vault empty. No stool.  Musculoskeletal: Normal range of motion.  Neurological: He is oriented to person, place, and time.  Skin: Skin is warm and dry. He is not diaphoretic.  Psychiatric: He has a normal mood and affect. His behavior is normal.     ED Treatments / Results  Labs (all labs ordered are listed, but only abnormal results are displayed) Labs Reviewed  COMPREHENSIVE METABOLIC PANEL - Abnormal; Notable for the following:       Result Value   Total Protein 9.3 (*)     Albumin 5.2 (*)    AST 82 (*)    All other components within normal limits  CBC WITH DIFFERENTIAL/PLATELET - Abnormal; Notable for the following:    Lymphs Abs 0.6 (*)    All other components within normal limits  ETHANOL - Abnormal; Notable for the following:    Alcohol, Ethyl (B) 215 (*)    All other components within normal limits  I-STAT CG4 LACTIC ACID, ED - Abnormal; Notable for the following:    Lactic Acid, Venous 3.19 (*)    All other components within normal limits  I-STAT CG4 LACTIC ACID, ED - Abnormal; Notable for the following:    Lactic Acid, Venous 2.11 (*)    All other components within normal limits  AMMONIA  I-STAT TROPOININ, ED  TYPE AND SCREEN  ABO/RH    EKG  EKG Interpretation None       Radiology Dg Chest 2 View  Result Date: 11/10/2016 CLINICAL DATA:  31 year old male with history of chest pain and hematemesis. Bloody stool. EXAM: CHEST  2 VIEW COMPARISON:  Chest x-ray 09/16/2014. FINDINGS: Lung volumes are normal. No consolidative airspace disease. No pleural effusions. No pneumothorax. No pulmonary nodule or mass noted. Pulmonary vasculature and the cardiomediastinal silhouette are within normal limits. IMPRESSION: No radiographic evidence of acute cardiopulmonary disease. Electronically Signed   By: Trudie Reedaniel  Entrikin M.D.   On: 11/10/2016 19:09    Procedures Procedures (including critical care time)  Medications Ordered in ED Medications  sodium chloride 0.9 % bolus 1,000 mL (0 mLs Intravenous Stopped 11/10/16 2107)  pantoprazole (PROTONIX) injection 40 mg (40 mg Intravenous Given 11/10/16 2113)  sodium chloride 0.9 % bolus 1,000 mL (0 mLs Intravenous Stopped 11/10/16 2226)     Initial Impression / Assessment and Plan / ED Course  I have reviewed the triage vital signs and the nursing notes.  Pertinent labs & imaging results that were available during my care of the patient were reviewed by me and considered in my medical decision making (see  chart for details).     31 year old male daily drinker presenting with blood in his vomit and stool. Patient's never had an EGD. Her. He has no active vomiting. He reports he last vomited was earlier today. We'll get labs, give IV Protonix.  11:15 PM Patient has normal vital signs, normal l hemoglobin. Suspect alcoholic gastritis versus ulcer. We'll give omeprazole. Will have follow-up with gastroenterology. We'll encourage decreased alcohol use. Given normal vital signs, normal hemoglobin, and reassuring physical exam we'll discharge home. No vomiting since arrival here. Patient feels much improved and is eager to leave.  Final Clinical Impressions(s) / ED Diagnoses   Final diagnoses:  None    New Prescriptions New Prescriptions   No medications on file     Abelino DerrickMackuen, Sammuel Blick Lyn, MD 11/10/16 2315

## 2016-11-10 NOTE — Discharge Instructions (Signed)
Please take the Prilosec every day. Please also stop drinking. Please follow up with gastroenterology as needed. Please return immediately if you have any bleeding in the your vomit, or other concerns.

## 2016-11-10 NOTE — ED Notes (Signed)
Bed: [id number] Expected date: 11/10/16 Expected time: 5:27 PM Means of arrival: Ambulance Comments: ? GI bleed from ETOH, Lethargic

## 2016-11-10 NOTE — ED Triage Notes (Signed)
He reports few episodes of hematemesis and "bloody stool" x 2-3 weeks. Today family "talked him into coming to hospital". He arrives in no distress.

## 2016-11-10 NOTE — ED Notes (Signed)
PT given Sprite and graham crackers. Pt tolerated without difficulty.

## 2016-11-10 NOTE — ED Notes (Signed)
Dr. Mackuen at bedside  

## 2016-11-10 NOTE — ED Notes (Signed)
During Dr. Marnee SpringMcCuen's interview his girlfriend tells us pt. Has been confused at times x 2-3 days. Currently, he is oriented x 4.

## 2016-11-11 LAB — ABO/RH: ABO/RH(D): O POS

## 2016-11-12 ENCOUNTER — Encounter: Payer: Self-pay | Admitting: Gastroenterology

## 2016-12-03 ENCOUNTER — Emergency Department (HOSPITAL_COMMUNITY): Admission: EM | Admit: 2016-12-03 | Discharge: 2016-12-03 | Discharge status: Against Medical Advice | Payer: Self-pay

## 2016-12-03 ENCOUNTER — Emergency Department (HOSPITAL_COMMUNITY): Admission: EM | Admit: 2016-12-03 | Discharge: 2016-12-03 | Discharge status: Absent without leave | Payer: Self-pay

## 2016-12-03 NOTE — ED Notes (Signed)
Called patient to triage. Unable to locate at this time. 

## 2016-12-04 ENCOUNTER — Encounter (HOSPITAL_COMMUNITY): Payer: Self-pay | Admitting: Emergency Medicine

## 2016-12-04 ENCOUNTER — Emergency Department (HOSPITAL_COMMUNITY): Payer: Self-pay

## 2016-12-04 ENCOUNTER — Emergency Department (HOSPITAL_COMMUNITY)
Admission: EM | Admit: 2016-12-04 | Discharge: 2016-12-04 | Disposition: A | Discharge status: Home / Self Care | Payer: Self-pay | Attending: Emergency Medicine | Admitting: Emergency Medicine

## 2016-12-04 DIAGNOSIS — S52514A Nondisplaced fracture of right radial styloid process, initial encounter for closed fracture: Secondary | ICD-10-CM | POA: Insufficient documentation

## 2016-12-04 DIAGNOSIS — Y929 Unspecified place or not applicable: Secondary | ICD-10-CM | POA: Insufficient documentation

## 2016-12-04 DIAGNOSIS — Y999 Unspecified external cause status: Secondary | ICD-10-CM | POA: Insufficient documentation

## 2016-12-04 DIAGNOSIS — Y9367 Activity, basketball: Secondary | ICD-10-CM | POA: Insufficient documentation

## 2016-12-04 DIAGNOSIS — S52571A Other intraarticular fracture of lower end of right radius, initial encounter for closed fracture: Secondary | ICD-10-CM

## 2016-12-04 DIAGNOSIS — W1830XA Fall on same level, unspecified, initial encounter: Secondary | ICD-10-CM | POA: Insufficient documentation

## 2016-12-04 DIAGNOSIS — F17210 Nicotine dependence, cigarettes, uncomplicated: Secondary | ICD-10-CM | POA: Insufficient documentation

## 2016-12-04 MED ORDER — HYDROCODONE-ACETAMINOPHEN 5-325 MG PO TABS: 1.0000 | ORAL_TABLET | Freq: Four times a day (QID) | ORAL | 0 refills | Status: DC | PRN

## 2016-12-04 MED ORDER — KETOROLAC TROMETHAMINE 30 MG/ML IJ SOLN
15.0000 mg | Freq: Once | INTRAMUSCULAR | Status: AC
  Administered 2016-12-04: 15 mg via INTRAMUSCULAR
  Filled 2016-12-04: qty 1

## 2016-12-04 MED ORDER — FENTANYL CITRATE (PF) 100 MCG/2ML IJ SOLN
50.0000 ug | INTRAMUSCULAR | Status: DC | PRN
  Administered 2016-12-04: 50 ug via NASAL
  Filled 2016-12-04: qty 2

## 2016-12-04 NOTE — ED Triage Notes (Signed)
Pt c/o right wrist pain onset yesterday after landing on wrist while playing basketball.  Right wrist deformed. Sensation and motor function intact.

## 2016-12-04 NOTE — Discharge Instructions (Signed)
Please read instructions below. Keep your arm elevated as much as possible. You can take Norco every 6 hours as needed for severe pain. Do not drive, take Tylenol, or drink alcohol taking this medication. You can take advil/ibuprofen in addition to Norco, every 6 hours for pain and swelling. Schedule an appointment with the Orthopedic specialist in 1 week for follow-up on your injury. Return to the ER for new or concerning symptoms.

## 2016-12-04 NOTE — Consult Note (Signed)
Reason for Consult: Right distal radius fracture Referring Physician:   Carlean JewsMario A Bradley is an 31 y.o. male.  HPI: The patient is a very pleasant 31 year old gentleman who presents to the emergency room setting today for a new evaluation of his right upper extremity and particularly his right wrist. He was playing basketball yesterday when he fell landing onto the right upper extremity. He did manage to finish the game he states but had increasing pain over the evening and into the night. He presents for a new evaluation of the wrist. He's previous been seen and evaluated by the emergency room staff and is noted to have a distal radius fracture present. We were consulted in regards to his care. Patient denies any numbness, tingling of the digits at this point time. He denies any elbow or shoulder pain. He denies any other injuries.  Past Medical History:  Diagnosis Date  . Alcohol abuse   History of alcoholic gastritis  History reviewed. No pertinent surgical history.  History reviewed. No pertinent family history.  Social History:  reports that he has been smoking Cigarettes.  He has been smoking about 0.50 packs per day. He has never used smokeless tobacco. He reports that he drinks alcohol. He reports that he uses drugs, including Marijuana.  Allergies: No Known Allergies  Medications:  Current Facility-Administered Medications:  .  fentaNYL (SUBLIMAZE) injection 50 mcg, 50 mcg, Nasal, Q20 Min PRN, Loren RacerYelverton, David, MD, 50 mcg at 12/04/16 [id number]  Current Outpatient Prescriptions:  .  acetaminophen (TYLENOL) 500 MG tablet, Take 1,000 mg by mouth every 6 (six) hours as needed for moderate pain or headache., Disp: , Rfl:  .  diphenhydrAMINE (BENADRYL) 25 MG tablet, Take 25 mg by mouth 2 (two) times daily as needed for allergies. , Disp: , Rfl:  .  omeprazole (PRILOSEC) 20 MG capsule, Take 1 capsule (20 mg total) by mouth daily., Disp: 30 capsule, Rfl: 0 .  sucralfate (CARAFATE) 1 g tablet, Take  1 tablet four times a day with meals and at bedtime., Disp: , Rfl: 0 .  cyclobenzaprine (FLEXERIL) 10 MG tablet, Take 1 tablet (10 mg total) by mouth 2 (two) times daily as needed for muscle spasms. (Patient not taking: Reported on 11/10/2016), Disp: 20 tablet, Rfl: 0 .  ibuprofen (ADVIL,MOTRIN) 600 MG tablet, Take 1 tablet (600 mg total) by mouth every 6 (six) hours as needed. (Patient not taking: Reported on 11/10/2016), Disp: 30 tablet, Rfl: 0  No results found for this or any previous visit (from the past 48 hour(s)).  Dg Forearm Right  Result Date: 12/04/2016 CLINICAL DATA:  Injured playing basketball with pain in the wrist EXAM: RIGHT FOREARM - 2 VIEW COMPARISON:  None. FINDINGS: No fracture of the right forearm is seen. However, there does appear to be a linear lucency within the distal right radius which may involve the radiocarpal joint space suggestive of nondisplaced fracture. Right wrist films with obliques would be recommended to assess further. IMPRESSION: 1. Suspect nondisplaced fracture of the distal right radius possibly involving the radial styloid and extending into the radiocarpal joint space. Recommend complete right wrist films. 2. No abnormality of the forearm otherwise. Electronically Signed   By: Dwyane DeePaul  Barry M.D.   On: 12/04/2016 09:24   Dg Wrist Complete Right  Result Date: 12/04/2016 CLINICAL DATA:  Right wrist pain. EXAM: RIGHT WRIST - COMPLETE 3+ VIEW COMPARISON:  Right forearm x-rays from today. FINDINGS: There is a nondisplaced fracture of the distal radius involving the radial  styloid and extending into the radiocarpal joint. No additional fractures seen. There is diffuse soft tissue swelling about the wrist. Joint spaces are preserved. IMPRESSION: Nondisplaced, intra-articular fracture of the distal radius involving the radial styloid. Electronically Signed   By: Obie Dredge M.D.   On: 12/04/2016 10:56    Review of Systems  Constitutional: Negative.   HENT: Negative.    Eyes: Negative.   Respiratory: Negative.   Cardiovascular: Negative.   Gastrointestinal:       See past medical history  Musculoskeletal:       See history of present illness  Skin: Negative.    Blood pressure (!) 152/93, pulse (!) 105, temperature 98 F (36.7 C), temperature source Oral, resp. rate 17, SpO2 100 %. Physical Exam The patient is alert and oriented in no acute distress. The patient complains of pain in the affected upper extremity.  The patient is noted to have a normal HEENT exam. Lung fields show equal chest expansion and no shortness of breath. Abdomen exam is nontender without distention. Lower extremity examination does not show any fracture dislocation or blood clot symptoms. Pelvis is stable and the neck and back are stable and nontender Right upper extremity: The patient is guarded, he has mild swelling about the right wrist. He is neurovascularly intact. Radial, ulnar, median nerves are intact. Gentle digital range of motion is intact and without significant pain. There are no signs of compartment syndrome. Forearm compartments are soft. He is point tender over the distal radius. Elbow is nontender shoulders nontender.. Assessment/Plan: Right distal radius fracture We have discussed with the patient the nature of his upper extremity predicament and the fracture pattern. We will attempt closed treatment at this juncture and have discussed with him at length the need to protect the upper extremity, to elevate this frequently and to move the digits frequently per to prevent swelling. He will not engage in any lifting, gripping, twisting pushing or pulling of the upper extremity. We have discussed with him if he has progressive collapse and/or angulation he certainly may need operative intervention. He understands. We have placed him into a well molded longer than usual short arm thumb spica cast. In addition, we have given him a rind lacer brace to be utilized into the  future once we wean him out of the cast pending his fracture does not subside. He will be discharged home he will be given hydrocodone for pain per the emergency room staff we will seem in 1 week for repeat radiographs in the cast. All questions were encouraged and answered.  Alpha Chouinard L 12/04/2016, 1:06 PM

## 2016-12-04 NOTE — ED Provider Notes (Signed)
[facility] DEPT Provider Note   CSN: [id number] Arrival date & time: 12/04/16  4098     History   Chief Complaint Chief Complaint  Patient presents with  . Wrist Pain    HPI Brett Bradley is a 31 y.o. male presenting with acute onset of right wrist pain s/p mechanical fall on outstretched hand, while playing basketball yesterday evening. Patient localizes constant sharp pain to right wrist, worse with movement, not improved with Tylenol. Denies previous injury to right wrist. Denies decrease in sensation or wound. No elbow or shoulder pain. No head trauma. Right-hand dominant.  The history is provided by the patient.    Past Medical History:  Diagnosis Date  . Alcohol abuse     There are no active problems to display for this patient.   History reviewed. No pertinent surgical history.     Home Medications    Prior to Admission medications   Medication Sig Start Date End Date Taking? Authorizing Provider  acetaminophen (TYLENOL) 500 MG tablet Take 1,000 mg by mouth every 6 (six) hours as needed for moderate pain or headache.   Yes [provider]  diphenhydrAMINE (BENADRYL) 25 MG tablet Take 25 mg by mouth 2 (two) times daily as needed for allergies.    Yes [provider]  omeprazole (PRILOSEC) 20 MG capsule Take 1 capsule (20 mg total) by mouth daily. 11/10/16  Yes Mackuen, Courteney Lyn, MD  sucralfate (CARAFATE) 1 g tablet Take 1 tablet four times a day with meals and at bedtime. 11/11/16  Yes [provider]  cyclobenzaprine (FLEXERIL) 10 MG tablet Take 1 tablet (10 mg total) by mouth 2 (two) times daily as needed for muscle spasms. Patient not taking: Reported on 11/10/2016 08/09/16   Fayrene Helper, PA-C  HYDROcodone-acetaminophen (NORCO/VICODIN) 5-325 MG tablet Take 1-2 tablets by mouth every 6 (six) hours as needed for severe pain. 12/04/16   Russo, [place] N, PA-C  ibuprofen (ADVIL,MOTRIN) 600 MG tablet Take 1 tablet (600 mg total) by mouth  every 6 (six) hours as needed. Patient not taking: Reported on 11/10/2016 08/09/16   Fayrene Helper, PA-C    Family History History reviewed. No pertinent family history.  Social History Social History  Substance Use Topics  . Smoking status: Current Every Day Smoker    Packs/day: 0.50    Types: Cigarettes  . Smokeless tobacco: Never Used  . Alcohol use Yes     Comment: daily     Allergies   Patient has no known allergies.   Review of Systems Review of Systems  Musculoskeletal: Positive for arthralgias (right wrist).  Skin: Negative for wound.  Neurological: Negative for numbness.     Physical Exam Updated Vital Signs BP (!) 152/93 (BP Location: Right Arm)   Pulse (!) 105   Temp 98 F (36.7 C) (Oral)   Resp 17   SpO2 100%   Physical Exam  Constitutional: He appears well-developed and well-nourished. No distress.  HENT:  Head: Normocephalic and atraumatic.  Eyes: Conjunctivae are normal.  Cardiovascular: Intact distal pulses.   Pulmonary/Chest: Effort normal.  Abdominal: Soft.  Musculoskeletal:  Intact radial and ulnar pulses. Right wrist and hand with edema, no open wounds or ecchymosis. Hand is warm, actively moving all fingers. Decreased range of motion of the wrist secondary to pain. Elbow without tenderness, normal range of motion.  Neurological: He is alert. No sensory deficit.  Normal sensation to right hand  Skin: Skin is warm.  Psychiatric: He has a normal mood  and affect. His behavior is normal.  Nursing note and vitals reviewed.    ED Treatments / Results  Labs (all labs ordered are listed, but only abnormal results are displayed) Labs Reviewed - No data to display  EKG  EKG Interpretation None       Radiology Dg Forearm Right  Result Date: 12/04/2016 CLINICAL DATA:  Injured playing basketball with pain in the wrist EXAM: RIGHT FOREARM - 2 VIEW COMPARISON:  None. FINDINGS: No fracture of the right forearm is seen. However, there does appear  to be a linear lucency within the distal right radius which may involve the radiocarpal joint space suggestive of nondisplaced fracture. Right wrist films with obliques would be recommended to assess further. IMPRESSION: 1. Suspect nondisplaced fracture of the distal right radius possibly involving the radial styloid and extending into the radiocarpal joint space. Recommend complete right wrist films. 2. No abnormality of the forearm otherwise. Electronically Signed   By: Dwyane DeePaul  Barry M.D.   On: 12/04/2016 09:24   Dg Wrist Complete Right  Result Date: 12/04/2016 CLINICAL DATA:  Right wrist pain. EXAM: RIGHT WRIST - COMPLETE 3+ VIEW COMPARISON:  Right forearm x-rays from today. FINDINGS: There is a nondisplaced fracture of the distal radius involving the radial styloid and extending into the radiocarpal joint. No additional fractures seen. There is diffuse soft tissue swelling about the wrist. Joint spaces are preserved. IMPRESSION: Nondisplaced, intra-articular fracture of the distal radius involving the radial styloid. Electronically Signed   By: Obie DredgeWilliam T Derry M.D.   On: 12/04/2016 10:56    Procedures Procedures (including critical care time)  Medications Ordered in ED Medications  fentaNYL (SUBLIMAZE) injection 50 mcg (50 mcg Nasal Given 12/04/16 0919)  ketorolac (TORADOL) 30 MG/ML injection 15 mg (15 mg Intramuscular Given 12/04/16 1120)     Initial Impression / Assessment and Plan / ED Course  I have reviewed the triage vital signs and the nursing notes.  Pertinent labs & imaging results that were available during my care of the patient were reviewed by me and considered in my medical decision making (see chart for details).     Patient with right wrist pain s/p fall on outstretched hand. NV intact. R-hand dominant. X-Ray with closed nondisplaced intraarticular fracture of distal radius and radial styloid. Pain managed in ED. Hand consulted, Karie ChimeraBrian Buchanan, PA-C, saw patient and applied thumb  spica cast. Pt to follow up in clinic in 1 week. Pt is safe for discharge.   Patient discussed with Dr. Ranae PalmsYelverton.  Discussed results, findings, treatment and follow up. Patient advised of return precautions. Patient verbalized understanding and agreed with plan.   Final Clinical Impressions(s) / ED Diagnoses   Final diagnoses:  Other closed intra-articular fracture of distal end of right radius, initial encounter    New Prescriptions New Prescriptions   HYDROCODONE-ACETAMINOPHEN (NORCO/VICODIN) 5-325 MG TABLET    Take 1-2 tablets by mouth every 6 (six) hours as needed for severe pain.     Russo, [place] N, PA-C 12/04/16 1320    Loren RacerYelverton, David, MD 12/09/16 1547

## 2016-12-31 ENCOUNTER — Encounter (HOSPITAL_COMMUNITY): Payer: Self-pay | Admitting: *Deleted

## 2016-12-31 ENCOUNTER — Emergency Department (HOSPITAL_COMMUNITY)
Admission: EM | Admit: 2016-12-31 | Discharge: 2016-12-31 | Disposition: A | Discharge status: Home / Self Care | Payer: Self-pay | Attending: Emergency Medicine | Admitting: Emergency Medicine

## 2016-12-31 ENCOUNTER — Emergency Department (HOSPITAL_COMMUNITY): Payer: Self-pay

## 2016-12-31 DIAGNOSIS — F17210 Nicotine dependence, cigarettes, uncomplicated: Secondary | ICD-10-CM | POA: Insufficient documentation

## 2016-12-31 DIAGNOSIS — J209 Acute bronchitis, unspecified: Secondary | ICD-10-CM | POA: Insufficient documentation

## 2016-12-31 DIAGNOSIS — J029 Acute pharyngitis, unspecified: Secondary | ICD-10-CM | POA: Insufficient documentation

## 2016-12-31 DIAGNOSIS — J028 Acute pharyngitis due to other specified organisms: Secondary | ICD-10-CM

## 2016-12-31 HISTORY — DX: Fracture of unspecified carpal bone, unspecified wrist, initial encounter for closed fracture: S62.109A

## 2016-12-31 LAB — RAPID STREP SCREEN (MED CTR MEBANE ONLY): STREPTOCOCCUS, GROUP A SCREEN (DIRECT): NEGATIVE

## 2016-12-31 MED ORDER — AZITHROMYCIN 250 MG PO TABS: 250.0000 mg | ORAL_TABLET | Freq: Every day | ORAL | 0 refills | Status: AC

## 2016-12-31 NOTE — ED Triage Notes (Signed)
Patient is alert and oriented x4.  He is being seen for a sore throat that started on Wednesday.  Currently patient states that he has not been able to swallow over the last two days.  Patient states that he has been coughing up brown phlegm.  Currently he rates his pain 10 of 10.

## 2016-12-31 NOTE — ED Notes (Signed)
Bed: [facility] Expected date:  Expected time:  Means of arrival:  Comments: 

## 2016-12-31 NOTE — ED Provider Notes (Signed)
[facility] Provider Note   CSN: [id number] Arrival date & time: 12/31/16  0631     History   Chief Complaint Chief Complaint  Patient presents with  . Sore Throat    HPI Brett Bradley is a 31 y.o. male.  HPI Patient is 31 year old male presents with complaints of sore throat and productive cough over the past 3 days.  Denies fever at home.  Reports painful swallowing.  No difficulty breathing.  Patient has not tried any medication for the pain.  He states she's been drinking beer instead to try and manage his symptoms.  Denies abdominal pain.  No nausea vomiting or diarrhea.      Past Medical History:  Diagnosis Date  . Alcohol abuse   . Wrist fracture    right    There are no active problems to display for this patient.   History reviewed. No pertinent surgical history.     Home Medications    Prior to Admission medications   Medication Sig Start Date End Date Taking? Authorizing Provider  azithromycin (ZITHROMAX Z-PAK) 250 MG tablet Take 1 tablet (250 mg total) by mouth daily. Take 2 tabs for first dose, then 1 tab for each additional dose 12/31/16   Azalia Bilis, MD    Family History History reviewed. No pertinent family history.  Social History Social History  Substance Use Topics  . Smoking status: Current Some Day Smoker    Packs/day: 0.50    Types: Cigarettes  . Smokeless tobacco: Never Used  . Alcohol use Yes     Comment: daily     Allergies   Patient has no known allergies.   Review of Systems Review of Systems  All other systems reviewed and are negative.    Physical Exam Updated Vital Signs BP (!) 145/89   Pulse 78   Temp 98.2 F (36.8 C)   Resp 18   Ht 6' (1.829 m)   Wt 90.7 kg (200 lb)   SpO2 99%   BMI 27.12 kg/m   Physical Exam  Constitutional: He is oriented to person, place, and time. He appears well-developed and well-nourished.  HENT:  Head: Normocephalic.  Posterior pharyngeal erythema and swelling.  No  evidence of peritonsillar abscess.  Uvula midline.  Tolerating secretions.  Oral airway patent.  Eyes: EOM are normal.  Neck: Normal range of motion.  Pulmonary/Chest: Effort normal.  Abdominal: He exhibits no distension.  Musculoskeletal: Normal range of motion.  Neurological: He is alert and oriented to person, place, and time.  Psychiatric: He has a normal mood and affect.  Nursing note and vitals reviewed.    ED Treatments / Results  Labs (all labs ordered are listed, but only abnormal results are displayed) Labs Reviewed  RAPID STREP SCREEN (NOT AT [facility])  CULTURE, GROUP A STREP [facility])    EKG  EKG Interpretation None       Radiology Dg Chest 2 View  Result Date: 12/31/2016 CLINICAL DATA:  Productive cough and sore throat EXAM: CHEST  2 VIEW COMPARISON:  None. FINDINGS: Normal mediastinum and cardiac silhouette. Normal pulmonary vasculature. No evidence of effusion, infiltrate, or pneumothorax. No acute bony abnormality. IMPRESSION: Normal chest radiograph Electronically Signed   By: Genevive Bi M.D.   On: 12/31/2016 07:41    Procedures Procedures (including critical care time)  Medications Ordered in ED Medications - No data to display   Initial Impression / Assessment and Plan / ED Course  I have reviewed the triage vital signs  and the nursing notes.  Pertinent labs & imaging results that were available during my care of the patient were reviewed by me and considered in my medical decision making (see chart for details).     Patient is well-appearing.  No signs of peritonsillar abscess.  Discharge home in good condition.    Final Clinical Impressions(s) / ED Diagnoses   Final diagnoses:  Pharyngitis due to other organism  Acute bronchitis, unspecified organism    New Prescriptions New Prescriptions   AZITHROMYCIN (ZITHROMAX Z-PAK) 250 MG TABLET    Take 1 tablet (250 mg total) by mouth daily. Take 2 tabs for first dose, then 1 tab for each  additional dose     Azalia Bilisampos, Stephanny Tsutsui, MD 12/31/16 [phone]

## 2016-12-31 NOTE — ED Notes (Signed)
Bed: [facility]06 Expected date:  Expected time:  Means of arrival:  Comments: 

## 2017-01-02 ENCOUNTER — Other Ambulatory Visit (INDEPENDENT_AMBULATORY_CARE_PROVIDER_SITE_OTHER): Payer: Self-pay

## 2017-01-02 ENCOUNTER — Encounter: Payer: Self-pay | Admitting: Gastroenterology

## 2017-01-02 ENCOUNTER — Ambulatory Visit (INDEPENDENT_AMBULATORY_CARE_PROVIDER_SITE_OTHER): Payer: Self-pay | Admitting: Gastroenterology

## 2017-01-02 VITALS — BP 120/70 | HR 68 | Ht 72.0 in | Wt 207.0 lb

## 2017-01-02 DIAGNOSIS — K920 Hematemesis: Secondary | ICD-10-CM

## 2017-01-02 DIAGNOSIS — R1115 Cyclical vomiting syndrome unrelated to migraine: Secondary | ICD-10-CM

## 2017-01-02 DIAGNOSIS — G43A0 Cyclical vomiting, not intractable: Secondary | ICD-10-CM

## 2017-01-02 DIAGNOSIS — G8929 Other chronic pain: Secondary | ICD-10-CM

## 2017-01-02 DIAGNOSIS — R1013 Epigastric pain: Secondary | ICD-10-CM

## 2017-01-02 LAB — H. PYLORI ANTIBODY, IGG: H Pylori IgG: NEGATIVE

## 2017-01-02 LAB — CULTURE, GROUP A STREP (THRC)

## 2017-01-02 MED ORDER — ONDANSETRON HCL 4 MG PO TABS: 4.0000 mg | ORAL_TABLET | Freq: Three times a day (TID) | ORAL | 1 refills | Status: AC | PRN

## 2017-01-02 NOTE — Patient Instructions (Addendum)
Omeprazole 20 mg once daily.Can be purchased over the counter. I have prescribed ondansetron to help control nausea.  Please please please work toward stopping alcohol use.  If you are age 29 or older, your body mass index should be between 23-30. Your Body mass index is 28.07 kg/m. If this is out of the aforementioned range listed, please consider follow up with your Primary Care Provider.  If you are age 29 or younger, your body mass index should be between 19-25. Your Body mass index is 28.07 kg/m. If this is out of the aformentioned range listed, please consider follow up with your Primary Care Provider.   Your physician has requested that you go to the basement for the following lab work before leaving today: H.Pylori IgG,antibody  Thank you for choosing [facility]  Dr Amada JupiterHenry Danis III

## 2017-01-02 NOTE — Progress Notes (Signed)
[name]:  History: Brett Bradley 01/02/2017  Referring physician: Emergency department physician Bary Castilla(Brett Mackuen, MD)  Reason for consult/chief complaint: Hematemesis (since leaving the ER still having the same problems. )   Subjective  HPI:  This is a 31 year old man referred to us after emergency department visit. He was seen there on July 14 for epigastric pain, hematemesis and maroon stool. He reported having been a heavy alcohol user for many years. During that ED visit, his alcohol level was 215, AST 82, ALT 41, alkaline phosphatase 51, total bilirubin 0.7 CBC normal He was back in the emergency department 2 days ago with a sore throat, productive cough and painful swallowing for a few days. Azithromycin was prescribed. He is with his wife today, and he denies any further bleeding since that July ED visit. Fortunately, he continues to drink up to 24 beers per day. He has frequent nausea and vomiting.  ROS:  Review of Systems  He denies chest pain dyspnea or dysuria, his appetite is fair and his weight stable. Remainder systems negative except as above Past Medical History: Past Medical History:  Diagnosis Date  . Alcohol abuse   . Wrist fracture    right     Past Surgical History: History reviewed. No pertinent surgical history. No prior surgery  Family History: Family History  Problem Relation Age of Onset  . Prostate cancer Paternal Grandfather    no known GI malignancies or liver disease  Social History: Social History   Social History  . Marital status: Married    Spouse name: N/A  . Number of children: 5  . Years of education: N/A   Occupational History  . unemployed    Social History Main Topics  . Smoking status: Current Some Day Smoker    Packs/day: 0.50    Types: Cigarettes  . Smokeless tobacco: Never Used  . Alcohol use 4.2 oz/week    7 Cans of beer per week     Comment: daily  . Drug use: Yes      Types: Marijuana  . Sexual activity: Yes   Other Topics Concern  . None   Social History Narrative  . None    Allergies: No Known Allergies  Outpatient Meds: Current Outpatient Prescriptions  Medication Sig Dispense Refill  . azithromycin (ZITHROMAX Z-PAK) 250 MG tablet Take 1 tablet (250 mg total) by mouth daily. Take 2 tabs for first dose, then 1 tab for each additional dose 6 tablet 0  . omeprazole (PRILOSEC) 20 MG capsule Take 20 mg by mouth daily.  0  . ondansetron (ZOFRAN) 4 MG tablet Take 1 tablet (4 mg total) by mouth every 8 (eight) hours as needed for nausea or vomiting. 30 tablet 1   No current facility-administered medications for this visit.       ___________________________________________________________________ Objective   Exam:  BP 120/70   Pulse 68   Ht 6' (1.829 m)   Wt 207 lb (93.9 kg)   BMI 28.07 kg/m    General: this is a(n) young man with no muscle wasting, alert, conversational and appropriate.   Eyes: sclera anicteric, no redness  ENT: oral mucosa moist without lesions, no cervical or supraclavicular lymphadenopathy, good dentition  CV: RRR without murmur, S1/S2, no JVD, no peripheral edema  Resp: clear to auscultation bilaterally, normal RR and effort noted  GI: soft, no tenderness, with active bowel sounds. No guarding or palpable organomegaly noted.  Skin; warm and dry, no rash or  jaundice noted. Multiple tattoos, no spider nevi  Neuro: awake, alert and oriented x 3. Normal gross motor function and fluent speech Neuro: Normal gross motor function, fluent speech, extraocular muscles intact without nystagmus, no asterixis Labs: Labs as noted above No abdominal imaging  Assessment: Encounter Diagnoses  Name Primary?  . Hematemesis with nausea Yes  . Non-intractable cyclical vomiting with nausea   . Abdominal pain, chronic, epigastric     A single episode of acute GI bleeding that sounds likely to been a Mallory-Weiss tear  from retching in the setting of heavy alcohol use. His hemoglobin is stable at the ED visit 2 days ago. We had a discussion about the extensive risks of heavy alcohol use.  Plan:  Serum H. pylori antibody Continue omeprazole 20 mg once daily, available over-the-counter Supply of Zofran was prescribed He unquestionably needs to work toward alcohol abstinence as soon as possible. It is not clear how motivated he is to do that.  He has no stigmata of cirrhosis, and his exam is not consistent with obstruction. I think his vomiting is related to heavy alcohol use and gastropathy. I do not think an upper endoscopy is necessary at this point. He does not use aspirin or NSAIDs regularly, and if we rule out H. pylori, I think his likelihood of ulcer is low.  Thank you for the courtesy of this consult.  Please call me with any questions or concerns.  Brett Bradley III

## 2017-04-27 ENCOUNTER — Encounter (HOSPITAL_COMMUNITY): Payer: Self-pay | Admitting: Nurse Practitioner

## 2017-04-27 ENCOUNTER — Emergency Department (HOSPITAL_COMMUNITY): Payer: Self-pay

## 2017-04-27 ENCOUNTER — Emergency Department (HOSPITAL_COMMUNITY)
Admission: EM | Admit: 2017-04-27 | Discharge: 2017-04-27 | Disposition: A | Discharge status: Home / Self Care | Payer: Self-pay | Attending: Emergency Medicine | Admitting: Emergency Medicine

## 2017-04-27 DIAGNOSIS — M25562 Pain in left knee: Secondary | ICD-10-CM | POA: Insufficient documentation

## 2017-04-27 DIAGNOSIS — Z5321 Procedure and treatment not carried out due to patient leaving prior to being seen by health care provider: Secondary | ICD-10-CM | POA: Insufficient documentation

## 2017-04-27 NOTE — ED Notes (Signed)
Informed by registration that pt stated he was leaving prior to being seen by provider.

## 2017-04-27 NOTE — ED Triage Notes (Signed)
Pt is c/o severe left knee pain. States he got injured while playing football with his children.

## 2018-02-08 IMAGING — CR DG FOREARM 2V*R*
4 series · 4 of 4 positions shown · non-contrast
Comparison: None.

CLINICAL DATA: Injured playing basketball with pain in the wrist

EXAM:
RIGHT FOREARM - 2 VIEW

[x forearm ap right]
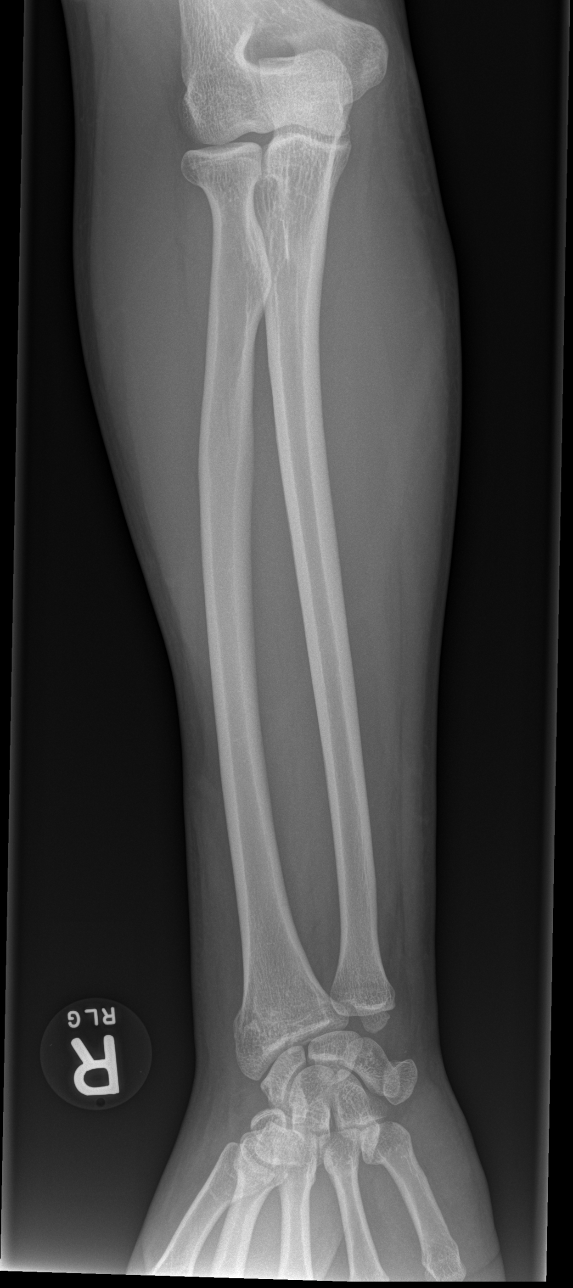

[x forearm lat right (1 of 3)]
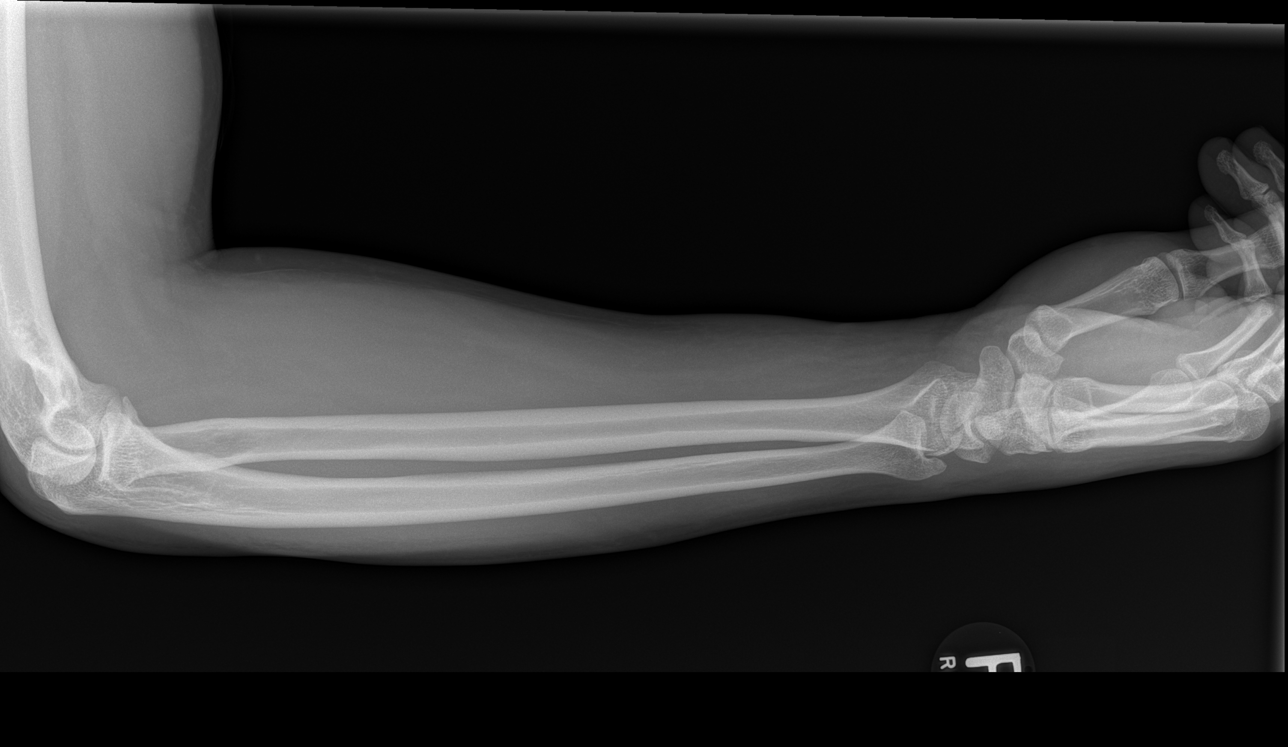

[x forearm lat right (2 of 3)]
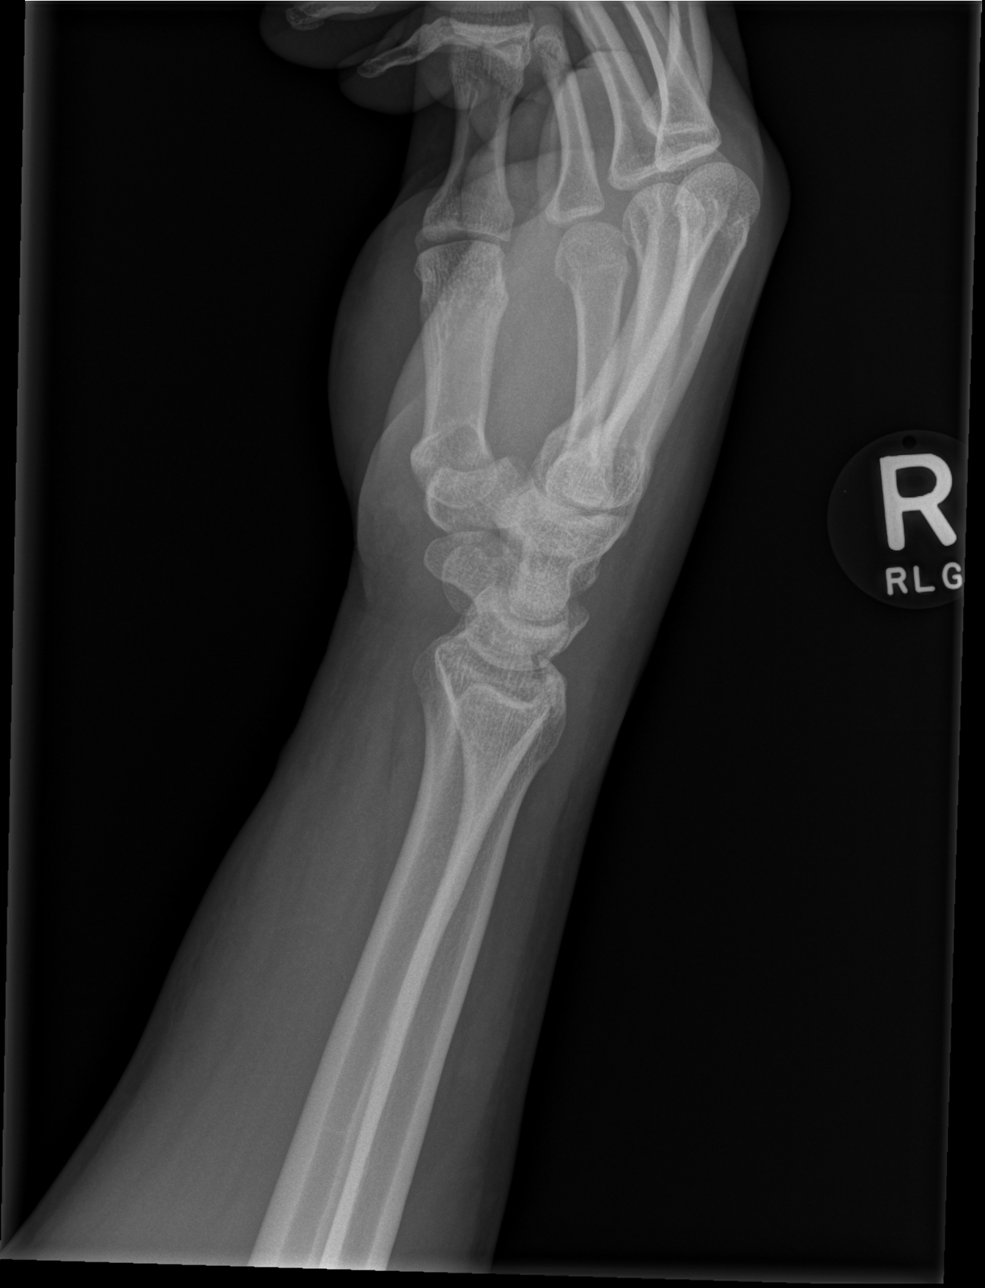

[x forearm lat right (3 of 3)]
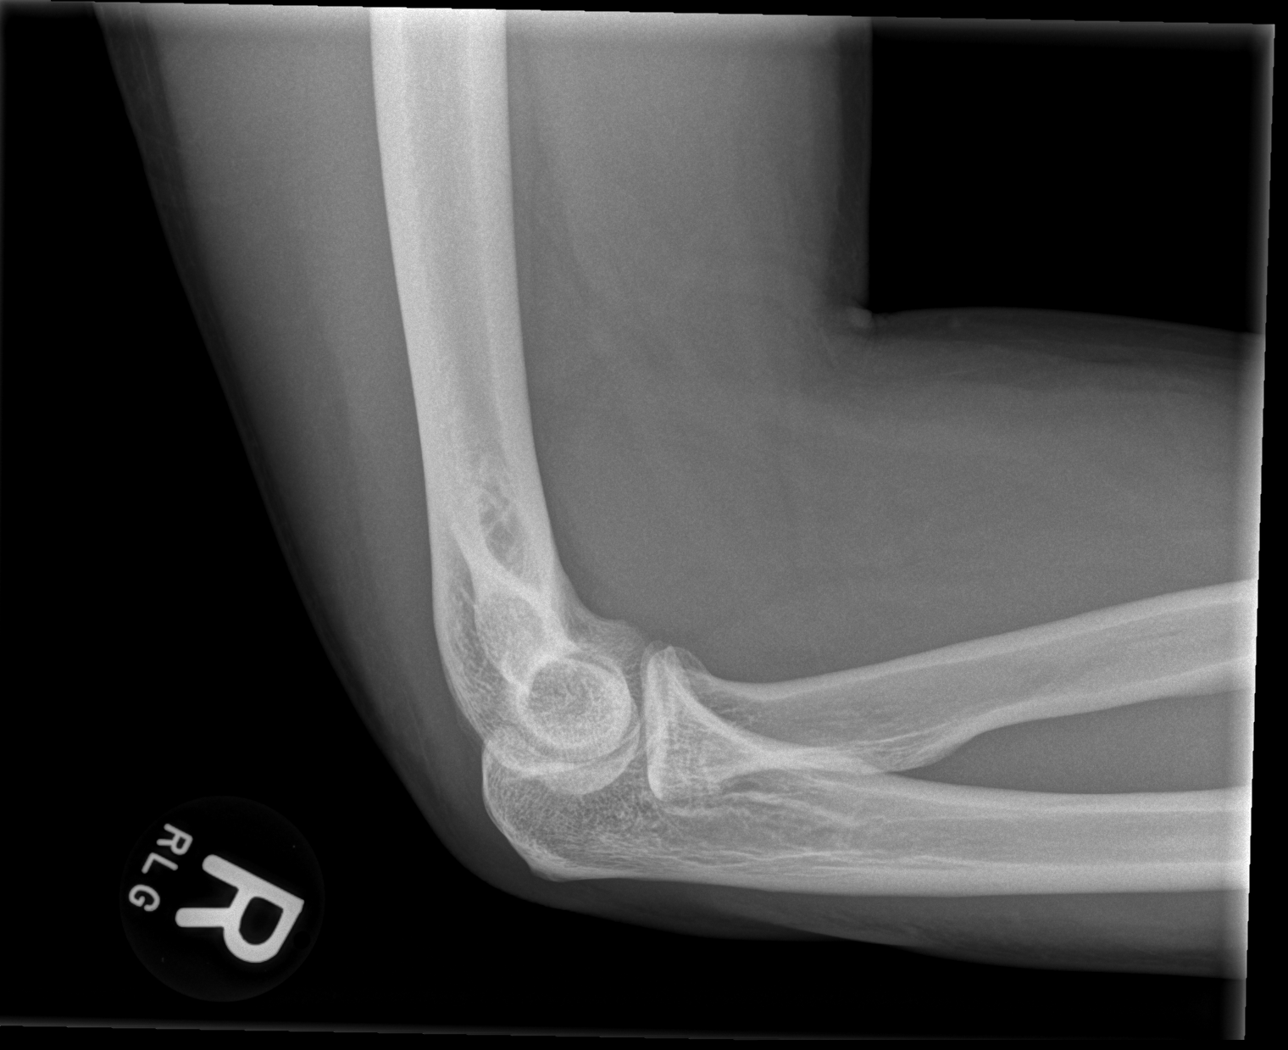

[4 of 4 positions shown; findings below may reference images not displayed]

FINDINGS: No fracture of the right forearm is seen. However, there does appear
to be a linear lucency within the distal right radius which may
involve the radiocarpal joint space suggestive of nondisplaced
fracture. Right wrist films with obliques would be recommended to
assess further.
IMPRESSION: 1. Suspect nondisplaced fracture of the distal right radius possibly
involving the radial styloid and extending into the radiocarpal
joint space. Recommend complete right wrist films.
2. No abnormality of the forearm otherwise.

## 2019-09-30 ENCOUNTER — Emergency Department (HOSPITAL_COMMUNITY): Payer: Self-pay

## 2019-09-30 ENCOUNTER — Other Ambulatory Visit: Payer: Self-pay

## 2019-09-30 ENCOUNTER — Emergency Department (HOSPITAL_COMMUNITY)
Admission: EM | Admit: 2019-09-30 | Discharge: 2019-09-30 | Disposition: A | Discharge status: Home / Self Care | Payer: Self-pay | Attending: Emergency Medicine | Admitting: Emergency Medicine

## 2019-09-30 ENCOUNTER — Encounter (HOSPITAL_COMMUNITY): Payer: Self-pay

## 2019-09-30 DIAGNOSIS — R197 Diarrhea, unspecified: Secondary | ICD-10-CM | POA: Insufficient documentation

## 2019-09-30 DIAGNOSIS — R1031 Right lower quadrant pain: Secondary | ICD-10-CM | POA: Insufficient documentation

## 2019-09-30 DIAGNOSIS — F17210 Nicotine dependence, cigarettes, uncomplicated: Secondary | ICD-10-CM | POA: Insufficient documentation

## 2019-09-30 DIAGNOSIS — F1210 Cannabis abuse, uncomplicated: Secondary | ICD-10-CM | POA: Insufficient documentation

## 2019-09-30 DIAGNOSIS — R109 Unspecified abdominal pain: Secondary | ICD-10-CM

## 2019-09-30 DIAGNOSIS — R1033 Periumbilical pain: Secondary | ICD-10-CM | POA: Insufficient documentation

## 2019-09-30 DIAGNOSIS — R112 Nausea with vomiting, unspecified: Secondary | ICD-10-CM | POA: Insufficient documentation

## 2019-09-30 LAB — COMPREHENSIVE METABOLIC PANEL
ALT: 62 U/L — ABNORMAL HIGH (ref 0–44)
AST: 98 U/L — ABNORMAL HIGH (ref 15–41)
Albumin: 4.5 g/dL (ref 3.5–5.0)
Alkaline Phosphatase: 72 U/L (ref 38–126)
Anion gap: 11 (ref 5–15)
BUN: 6 mg/dL (ref 6–20)
CO2: 25 mmol/L (ref 22–32)
Calcium: 9 mg/dL (ref 8.9–10.3)
Chloride: 104 mmol/L (ref 98–111)
Creatinine, Ser: 1.05 mg/dL (ref 0.61–1.24)
GFR calc Af Amer: >60 mL/min (ref >60)
GFR calc non Af Amer: >60 mL/min (ref >60)
Glucose, Bld: 99 mg/dL (ref 70–99)
Potassium: 4.2 mmol/L (ref 3.5–5.1)
Sodium: 140 mmol/L (ref 135–145)
Total Bilirubin: 0.6 mg/dL (ref 0.3–1.2)
Total Protein: 7.6 g/dL (ref 6.5–8.1)

## 2019-09-30 LAB — CBC WITH DIFFERENTIAL/PLATELET
Abs Immature Granulocytes: 0.01 10*3/uL (ref 0.00–0.07)
Basophils Absolute: 0 10*3/uL (ref 0.0–0.1)
Basophils Relative: 1 %
Eosinophils Absolute: 0.2 10*3/uL (ref 0.0–0.5)
Eosinophils Relative: 8 %
HCT: 43.8 % (ref 39.0–52.0)
Hemoglobin: 14.7 g/dL (ref 13.0–17.0)
Immature Granulocytes: 0 %
Lymphocytes Relative: 39 %
Lymphs Abs: 1.2 10*3/uL (ref 0.7–4.0)
MCH: 29.7 pg (ref 26.0–34.0)
MCHC: 33.6 g/dL (ref 30.0–36.0)
MCV: 88.5 fL (ref 80.0–100.0)
Monocytes Absolute: 0.4 10*3/uL (ref 0.1–1.0)
Monocytes Relative: 13 %
Neutro Abs: 1.2 10*3/uL — ABNORMAL LOW (ref 1.7–7.7)
Neutrophils Relative %: 39 %
Platelets: 169 10*3/uL (ref 150–400)
RBC: 4.95 MIL/uL (ref 4.22–5.81)
RDW: 14.2 % (ref 11.5–15.5)
WBC: 3.1 10*3/uL — ABNORMAL LOW (ref 4.0–10.5)
nRBC: 0 % (ref 0.0–0.2)

## 2019-09-30 LAB — URINALYSIS, ROUTINE W REFLEX MICROSCOPIC
Bilirubin Urine: NEGATIVE
Glucose, UA: NEGATIVE mg/dL
Hgb urine dipstick: NEGATIVE
Ketones, ur: NEGATIVE mg/dL
Leukocytes,Ua: NEGATIVE
Nitrite: NEGATIVE
Protein, ur: NEGATIVE mg/dL
Specific Gravity, Urine: 1.001 — ABNORMAL LOW (ref 1.005–1.030)
pH: 6 (ref 5.0–8.0)

## 2019-09-30 LAB — LIPASE, BLOOD: Lipase: 41 U/L (ref 11–51)

## 2019-09-30 LAB — LACTIC ACID, PLASMA: Lactic Acid, Venous: 1.3 mmol/L (ref 0.5–1.9)

## 2019-09-30 MED ORDER — SODIUM CHLORIDE (PF) 0.9 % IJ SOLN
INTRAMUSCULAR | Status: AC
  Filled 2019-09-30: qty 50

## 2019-09-30 MED ORDER — MORPHINE SULFATE (PF) 4 MG/ML IV SOLN
4.0000 mg | Freq: Once | INTRAVENOUS | Status: AC
  Administered 2019-09-30: 4 mg via INTRAVENOUS
  Filled 2019-09-30: qty 1

## 2019-09-30 MED ORDER — ONDANSETRON HCL 4 MG/2ML IJ SOLN
4.0000 mg | Freq: Once | INTRAMUSCULAR | Status: AC
  Administered 2019-09-30: 4 mg via INTRAVENOUS
  Filled 2019-09-30: qty 2

## 2019-09-30 MED ORDER — IOHEXOL 300 MG/ML  SOLN
100.0000 mL | Freq: Once | INTRAMUSCULAR | Status: AC | PRN
  Administered 2019-09-30: 100 mL via INTRAVENOUS

## 2019-09-30 MED ORDER — CYCLOBENZAPRINE HCL 10 MG PO TABS: 10.0000 mg | ORAL_TABLET | Freq: Two times a day (BID) | ORAL | 0 refills | Status: AC | PRN

## 2019-09-30 MED ORDER — SODIUM CHLORIDE 0.9 % IV BOLUS
1000.0000 mL | Freq: Once | INTRAVENOUS | Status: AC
  Administered 2019-09-30: 1000 mL via INTRAVENOUS

## 2019-09-30 NOTE — ED Provider Notes (Signed)
[facility]-EMERGENCY DEPT Provider Note   CSN: [id number] Arrival date & time: 09/30/19  5027     History Chief Complaint  Patient presents with  . Abdominal Pain  . Diarrhea  . Dysuria    Brett Bradley is a 34 y.o. male.  The history is provided by the patient and medical records. No language interpreter was used.  Abdominal Pain Pain location:  Periumbilical, R flank and RLQ Pain quality: aching   Pain radiates to:  R flank and suprapubic region Pain severity:  Severe Onset quality:  Gradual Duration:  2 weeks Timing:  Constant Progression:  Waxing and waning Chronicity:  New Context: alcohol use   Context: not previous surgeries, not recent illness and not trauma   Relieved by:  Nothing Worsened by:  Urination and palpation Ineffective treatments:  None tried Associated symptoms: diarrhea, nausea and vomiting   Associated symptoms: no chest pain, no chills, no constipation, no cough, no dysuria, no fatigue, no fever, no flatus and no shortness of breath   Risk factors: alcohol abuse        Past Medical History:  Diagnosis Date  . Alcohol abuse   . Wrist fracture    right    There are no problems to display for this patient.   History reviewed. No pertinent surgical history.     Family History  Problem Relation Age of Onset  . Prostate cancer Paternal Grandfather     Social History   Tobacco Use  . Smoking status: Current Some Day Smoker    Packs/day: 0.50    Types: Cigarettes  . Smokeless tobacco: Never Used  Substance Use Topics  . Alcohol use: Yes    Comment: daily  . Drug use: Yes    Types: Marijuana    Home Medications Prior to Admission medications   Medication Sig Start Date End Date Taking? Authorizing Provider  azithromycin (ZITHROMAX Z-PAK) 250 MG tablet Take 1 tablet (250 mg total) by mouth daily. Take 2 tabs for first dose, then 1 tab for each additional dose 12/31/16   Azalia Bilis, MD  omeprazole  (PRILOSEC) 20 MG capsule Take 20 mg by mouth daily. 11/11/16   [provider]  ondansetron (ZOFRAN) 4 MG tablet Take 1 tablet (4 mg total) by mouth every 8 (eight) hours as needed for nausea or vomiting. 01/02/17   Sherrilyn Rist, MD    Allergies    Patient has no known allergies.  Review of Systems   Review of Systems  Constitutional: Negative for chills, diaphoresis, fatigue and fever.  HENT: Negative for congestion and rhinorrhea.   Respiratory: Negative for cough, chest tightness, shortness of breath, wheezing and stridor.   Cardiovascular: Negative for chest pain, palpitations and leg swelling.  Gastrointestinal: Positive for abdominal pain, diarrhea, nausea and vomiting. Negative for constipation and flatus.  Genitourinary: Positive for flank pain. Negative for difficulty urinating, dysuria and frequency.  Musculoskeletal: Negative for back pain, neck pain and neck stiffness.  Skin: Negative for rash and wound.  Neurological: Negative for weakness, light-headedness, numbness and headaches.  Psychiatric/Behavioral: Negative for agitation and confusion.  All other systems reviewed and are negative.   Physical Exam Updated Vital Signs BP (!) 155/93 (BP Location: Left Arm)   Pulse 82   Temp 98.5 F (36.9 C) (Oral)   Resp 16   Ht 5\' 11"  (1.803 m)   Wt 90.7 kg   SpO2 96%   BMI 27.89 kg/m   Physical Exam Vitals  and nursing note reviewed.  Constitutional:      General: He is not in acute distress.    Appearance: He is well-developed. He is not ill-appearing, toxic-appearing or diaphoretic.  HENT:     Head: Normocephalic and atraumatic.     Right Ear: External ear normal.     Left Ear: External ear normal.     Nose: Nose normal.     Mouth/Throat:     Pharynx: No oropharyngeal exudate.  Eyes:     Conjunctiva/sclera: Conjunctivae normal.     Pupils: Pupils are equal, round, and reactive to light.  Cardiovascular:     Rate and Rhythm: Normal rate.     Heart  sounds: Normal heart sounds. No murmur.  Pulmonary:     Effort: Pulmonary effort is normal. No respiratory distress.     Breath sounds: No stridor. No wheezing, rhonchi or rales.  Chest:     Chest wall: No tenderness.  Abdominal:     General: Abdomen is flat. Bowel sounds are normal. There is no distension.     Palpations: Abdomen is soft.     Tenderness: There is abdominal tenderness in the right lower quadrant, periumbilical area and suprapubic area. There is right CVA tenderness. There is no left CVA tenderness, guarding or rebound.  Musculoskeletal:     Cervical back: Normal range of motion and neck supple.  Skin:    General: Skin is warm.     Findings: No erythema or rash.  Neurological:     General: No focal deficit present.     Mental Status: He is alert and oriented to person, place, and time.     Cranial Nerves: No cranial nerve deficit.     Motor: No abnormal muscle tone.     Coordination: Coordination normal.     Deep Tendon Reflexes: Reflexes normal.  Psychiatric:        Mood and Affect: Mood normal.     ED Results / Procedures / Treatments   Labs (all labs ordered are listed, but only abnormal results are displayed) Labs Reviewed  CBC WITH DIFFERENTIAL/PLATELET - Abnormal; Notable for the following components:      Result Value   WBC 3.1 (*)    Neutro Abs 1.2 (*)    All other components within normal limits  COMPREHENSIVE METABOLIC PANEL - Abnormal; Notable for the following components:   AST 98 (*)    ALT 62 (*)    All other components within normal limits  URINALYSIS, ROUTINE W REFLEX MICROSCOPIC - Abnormal; Notable for the following components:   Specific Gravity, Urine 1.001 (*)    All other components within normal limits  URINE CULTURE  LACTIC ACID, PLASMA  LIPASE, BLOOD    EKG None  Radiology CT ABDOMEN PELVIS W CONTRAST  Result Date: 09/30/2019 CLINICAL DATA:  Right lower quadrant abdominal pain. EXAM: CT ABDOMEN AND PELVIS WITH CONTRAST  TECHNIQUE: Multidetector CT imaging of the abdomen and pelvis was performed using the standard protocol following bolus administration of intravenous contrast. CONTRAST:  OMNIPAQUE IOHEXOL 300 MG/ML  SOLN COMPARISON:  None. FINDINGS: Lower chest: Dependent atelectasis. Hepatobiliary: Hepatic steatosis. No focal hepatic lesion. No biliary dilatation. Gallbladder is unremarkable. Pancreas: No focal lesions or pancreatic ductal dilatation. No surrounding inflammation. Spleen: Unremarkable. Adrenals/Urinary Tract: Adrenal glands are unremarkable. No focal renal lesion or calculi. No hydronephrosis. Bladder is decompressed. Stomach/Bowel: Stomach is within normal limits. Appendix appears normal. No secondary signs of right lower quadrant inflammation. No evidence of obstruction. No  bowel wall thickening or inflammatory changes. No ascites. Vascular/Lymphatic: Vasculature is within normal limits for patient's age. No abdominopelvic adenopathy. Reproductive: Unremarkable. Other: Small fat containing 2.7 cm paraumbilical hernia (9:81). External soft tissues are otherwise unremarkable. Musculoskeletal: Mild spondylosis.  No acute osseous abnormality. IMPRESSION: No acute intra-abdominal process.  Normal appendix. No nephrolithiasis or hydronephrosis. Small fat containing periumbilical hernia. Hepatic steatosis. Electronically Signed   By: Primitivo Gauze M.D.   On: 09/30/2019 12:43    Procedures Procedures (including critical care time)  Medications Ordered in ED Medications  sodium chloride (PF) 0.9 % injection (has no administration in time range)  morphine 4 MG/ML injection 4 mg (4 mg Intravenous Given 09/30/19 1010)  ondansetron (ZOFRAN) injection 4 mg (4 mg Intravenous Given 09/30/19 1011)  sodium chloride 0.9 % bolus 1,000 mL (0 mLs Intravenous Stopped 09/30/19 1326)  iohexol (OMNIPAQUE) 300 MG/ML solution 100 mL (100 mLs Intravenous Contrast Given 09/30/19 1148)      ED Course  I have reviewed the  triage vital signs and the nursing notes.  Pertinent labs & imaging results that were available during my care of the patient were reviewed by me and considered in my medical decision making (see chart for details).    MDM Rules/Calculators/A&P                      BOLUWATIFE FLIGHT is a 34 y.o. male with a past medical history significant for alcohol abuse who presents with right flank and right lower quadrant abdominal pain.  Patient reports he has had symptoms for the last 2 weeks and it started in his right abdomen rating towards his right flank/right back as well as his right lower abdomen.  He still has his appendix and gallbladder.  He reports that he is been trying to drink to help with the pain.  He reports the pain is severe, and 8 out of 10 in severity.  He says that it does hurt when he urinates but he denies dysuria.  He has a strong family history of kidney stones but he has never had a stone himself.  He reports of nausea and vomiting occasionally and also reports some diarrhea.  No constipation.  No trauma.  No fevers, chills, chest pain, shortness of breath, or Covid exposures.  He has not taken medicine help with symptoms, only alcohol.  He denies trauma.  On exam, patient has some right flank tenderness and right lower quadrant abdominal tenderness.  He reports the pain was near his umbilicus but then moved more to the right side.  He deferred GU exam and denied any GU symptoms.  He specifically denies any penile or scrotal rashes or tenderness or pain.   Clinical aspect patient has kidney stones or UTI however, due to the persistent right lower quadrant and umbilicus pain, will get a CT scan to rule out appendicitis or other etiology.  You were given pain medicine, nausea medicine, fluids, made n.p.o., will get labs and urinalysis as well.  Anticipate reassessment after work-up.         Patient's work-up was overall reassuring.  No evidence of urinary tract infection.  CT scan  showed no evidence of diverticulitis, appendicitis, gallbladder disease, or other intra-abdominal pathology.  Given the tenderness and possible muscle spasms on exam, suspect torso wall and muscles pain.  Will give prescription for muscle relaxants and encourage patient to decrease his alcohol intake.  LFTs were slightly more elevated than prior but similar.  Patient will follow up with his primary doctor for reassessment of this.  Patient agreed with plan of care no other worsens or concerns.  He was discharged in good condition with improved symptoms.  Final Clinical Impression(s) / ED Diagnoses Final diagnoses:  Acute flank pain    Rx / DC Orders ED Discharge Orders         Ordered    cyclobenzaprine (FLEXERIL) 10 MG tablet  2 times daily PRN     09/30/19 1355         Clinical Impression: 1. Acute flank pain     Disposition: Discharge  Condition: Good  I have discussed the results, Dx and Tx plan with the pt(& family if present). He/she/they expressed understanding and agree(s) with the plan. Discharge instructions discussed at great length. Strict return precautions discussed and pt &/or family have verbalized understanding of the instructions. No further questions at time of discharge.    Discharge Medication List as of 09/30/2019  1:55 PM    START taking these medications   Details  cyclobenzaprine (FLEXERIL) 10 MG tablet Take 1 tablet (10 mg total) by mouth 2 (two) times daily as needed for muscle spasms (right flank muscle spasms)., Starting Wed 09/30/2019, Print        Follow Up: [facility] [address] [place] [address] [phone] Schedule an appointment as soon as possible for a visit    [facility]  HOSP[facility]-EMERGENCY DEPT [address] [id number] mc [place] [address] [phone]       Amar Keenum, Canary Brim, MD 09/30/19 1401

## 2019-09-30 NOTE — ED Triage Notes (Addendum)
Patient c/o right mid abdominal pain, diarrhea, and pain when urinating x 2 weeks. Patient states he is not having a burning sensation when urinating, but feels like "something is trying to come out." Patient states he has been "drinking alcohol to numb the pain."

## 2019-09-30 NOTE — Discharge Instructions (Signed)
Your work-up today was overall reassuring and we did not see evidence of appendicitis, gallbladder disease, or kidney stones causing your flank pain.  As you are tender and there may been some muscle spasms, will treat you with some muscle relaxants for the flank and torso wall pains.  Please rest and stay hydrated.  Please try to cut back on the alcohol and stay hydrated.  Please follow-up with your primary doctor.  If any symptoms change or worsen, please return to the nearest emergency department.

## 2019-10-01 LAB — URINE CULTURE: Culture: NO GROWTH

## 2020-03-03 ENCOUNTER — Emergency Department (HOSPITAL_COMMUNITY): Payer: Self-pay

## 2020-03-03 ENCOUNTER — Other Ambulatory Visit: Payer: Self-pay

## 2020-03-03 ENCOUNTER — Emergency Department (HOSPITAL_COMMUNITY)
Admission: EM | Admit: 2020-03-03 | Discharge: 2020-03-03 | Disposition: A | Discharge status: Home / Self Care | Payer: Self-pay | Attending: Emergency Medicine | Admitting: Emergency Medicine

## 2020-03-03 ENCOUNTER — Encounter (HOSPITAL_COMMUNITY): Payer: Self-pay

## 2020-03-03 DIAGNOSIS — F17210 Nicotine dependence, cigarettes, uncomplicated: Secondary | ICD-10-CM | POA: Insufficient documentation

## 2020-03-03 DIAGNOSIS — R519 Headache, unspecified: Secondary | ICD-10-CM | POA: Insufficient documentation

## 2020-03-03 DIAGNOSIS — R569 Unspecified convulsions: Secondary | ICD-10-CM

## 2020-03-03 DIAGNOSIS — Y901 Blood alcohol level of 20-39 mg/100 ml: Secondary | ICD-10-CM | POA: Insufficient documentation

## 2020-03-03 DIAGNOSIS — F1590 Other stimulant use, unspecified, uncomplicated: Secondary | ICD-10-CM | POA: Insufficient documentation

## 2020-03-03 DIAGNOSIS — F10239 Alcohol dependence with withdrawal, unspecified: Secondary | ICD-10-CM | POA: Insufficient documentation

## 2020-03-03 DIAGNOSIS — F10930 Alcohol use, unspecified with withdrawal, uncomplicated: Secondary | ICD-10-CM

## 2020-03-03 LAB — CBC WITH DIFFERENTIAL/PLATELET
Abs Immature Granulocytes: 0.02 10*3/uL (ref 0.00–0.07)
Basophils Absolute: 0.1 10*3/uL (ref 0.0–0.1)
Basophils Relative: 1 %
Eosinophils Absolute: 0.9 10*3/uL — ABNORMAL HIGH (ref 0.0–0.5)
Eosinophils Relative: 10 %
HCT: 40.1 % (ref 39.0–52.0)
Hemoglobin: 13.9 g/dL (ref 13.0–17.0)
Immature Granulocytes: 0 %
Lymphocytes Relative: 13 %
Lymphs Abs: 1.3 10*3/uL (ref 0.7–4.0)
MCH: 30.2 pg (ref 26.0–34.0)
MCHC: 34.7 g/dL (ref 30.0–36.0)
MCV: 87.2 fL (ref 80.0–100.0)
Monocytes Absolute: 0.9 10*3/uL (ref 0.1–1.0)
Monocytes Relative: 9 %
Neutro Abs: 6.3 10*3/uL (ref 1.7–7.7)
Neutrophils Relative %: 67 %
Platelets: 180 10*3/uL (ref 150–400)
RBC: 4.6 MIL/uL (ref 4.22–5.81)
RDW: 15.7 % — ABNORMAL HIGH (ref 11.5–15.5)
WBC: 9.3 10*3/uL (ref 4.0–10.5)
nRBC: 0 % (ref 0.0–0.2)

## 2020-03-03 LAB — COMPREHENSIVE METABOLIC PANEL
ALT: 55 U/L — ABNORMAL HIGH (ref 0–44)
AST: 77 U/L — ABNORMAL HIGH (ref 15–41)
Albumin: 4.1 g/dL (ref 3.5–5.0)
Alkaline Phosphatase: 55 U/L (ref 38–126)
Anion gap: 11 (ref 5–15)
BUN: 9 mg/dL (ref 6–20)
CO2: 23 mmol/L (ref 22–32)
Calcium: 9.1 mg/dL (ref 8.9–10.3)
Chloride: 100 mmol/L (ref 98–111)
Creatinine, Ser: 0.91 mg/dL (ref 0.61–1.24)
GFR, Estimated: >60 mL/min (ref >60)
Glucose, Bld: 129 mg/dL — ABNORMAL HIGH (ref 70–99)
Potassium: 3.6 mmol/L (ref 3.5–5.1)
Sodium: 134 mmol/L — ABNORMAL LOW (ref 135–145)
Total Bilirubin: 0.9 mg/dL (ref 0.3–1.2)
Total Protein: 7 g/dL (ref 6.5–8.1)

## 2020-03-03 LAB — ETHANOL: Alcohol, Ethyl (B): 29 mg/dL — ABNORMAL HIGH (ref <10)

## 2020-03-03 LAB — TROPONIN I (HIGH SENSITIVITY): Troponin I (High Sensitivity): 12 ng/L (ref <18)

## 2020-03-03 MED ORDER — LORAZEPAM 2 MG/ML IJ SOLN
1.0000 mg | Freq: Once | INTRAMUSCULAR | Status: AC
  Administered 2020-03-03: 1 mg via INTRAVENOUS
  Filled 2020-03-03: qty 1

## 2020-03-03 MED ORDER — CHLORDIAZEPOXIDE HCL 25 MG PO CAPS: ORAL_CAPSULE | ORAL | 0 refills | Status: AC

## 2020-03-03 NOTE — Discharge Instructions (Signed)
Drink less.  Take the medicine to help taper off.  Follow-up with one of the neurology groups.  You are not legally allowed to drive for at least 6 months.

## 2020-03-03 NOTE — ED Provider Notes (Signed)
[facility]-EMERGENCY DEPT Provider Note   CSN: [id number] Arrival date & time: 03/03/20  2124     History Chief Complaint  Patient presents with  . Possible Seizure    Brett Bradley is a 34 y.o. male.  HPI Patient presents after possible seizure.  States he does not know what happened.  States he just woke up in the back of an ambulance.  Reportedly had refused care and then came in by patient's wife.  Thinks he had a seizure.  He had bit his tongue.  He has a headache.  States he does not have a history of seizures but reviewing records he had a seizure around 5 years ago presumed to alcohol abuse.  Patient states he does drink alcohol and has been drinking a little less recently.  His muscles ache.  Denies other drug use.  States he feels back to being himself now.    Past Medical History:  Diagnosis Date  . Alcohol abuse   . Wrist fracture    right    There are no problems to display for this patient.   History reviewed. No pertinent surgical history.     Family History  Problem Relation Age of Onset  . Prostate cancer Paternal Grandfather     Social History   Tobacco Use  . Smoking status: Current Some Day Smoker    Packs/day: 0.50    Types: Cigarettes  . Smokeless tobacco: Never Used  Vaping Use  . Vaping Use: Never used  Substance Use Topics  . Alcohol use: Yes    Comment: daily  . Drug use: Yes    Types: Marijuana    Home Medications Prior to Admission medications   Medication Sig Start Date End Date Taking? Authorizing Provider  azithromycin (ZITHROMAX Z-PAK) 250 MG tablet Take 1 tablet (250 mg total) by mouth daily. Take 2 tabs for first dose, then 1 tab for each additional dose Patient not taking: Reported on 03/03/2020 12/31/16   Azalia Bilis, MD  chlordiazePOXIDE (LIBRIUM) 25 MG capsule 50mg  PO TID x 1D, then 25-50mg  PO BID X 1D, then 25-50mg  PO QD X 1D 03/03/20   13/4/21, MD  cyclobenzaprine (FLEXERIL) 10 MG tablet  Take 1 tablet (10 mg total) by mouth 2 (two) times daily as needed for muscle spasms (right flank muscle spasms). Patient not taking: Reported on 03/03/2020 09/30/19   Tegeler, 11/30/19, MD  ondansetron (ZOFRAN) 4 MG tablet Take 1 tablet (4 mg total) by mouth every 8 (eight) hours as needed for nausea or vomiting. Patient not taking: Reported on 03/03/2020 01/02/17   03/04/17, MD    Allergies    Patient has no known allergies.  Review of Systems   Review of Systems  Constitutional: Negative for appetite change.  HENT: Negative for congestion.   Respiratory: Negative for shortness of breath.   Cardiovascular: Negative for chest pain.  Gastrointestinal: Negative for abdominal pain.  Genitourinary: Negative for flank pain.  Musculoskeletal: Positive for myalgias.  Skin: Negative for rash.  Neurological: Positive for seizures and headaches.  Psychiatric/Behavioral: Negative for confusion.    Physical Exam Updated Vital Signs BP (!) 148/95   Pulse 87   Temp 98.6 F (37 C) (Oral)   Resp 15   Ht 5\' 10"  (1.778 m)   Wt 83.9 kg   SpO2 97%   BMI 26.54 kg/m   Physical Exam HENT:     Head: Normocephalic.     Mouth/Throat:  Mouth: Mucous membranes are moist.     Comments: Patient with bite marks to tongue. Eyes:     General: No scleral icterus. Cardiovascular:     Rate and Rhythm: Regular rhythm.  Pulmonary:     Breath sounds: No wheezing or rhonchi.  Musculoskeletal:        General: No tenderness.     Cervical back: Neck supple. No tenderness.  Skin:    General: Skin is warm.     Capillary Refill: Capillary refill takes less than 2 seconds.  Neurological:     Mental Status: He is alert and oriented to person, place, and time. Mental status is at baseline.  Psychiatric:        Mood and Affect: Mood normal.     ED Results / Procedures / Treatments   Labs (all labs ordered are listed, but only abnormal results are displayed) Labs Reviewed  COMPREHENSIVE  METABOLIC PANEL - Abnormal; Notable for the following components:      Result Value   Sodium 134 (*)    Glucose, Bld 129 (*)    AST 77 (*)    ALT 55 (*)    All other components within normal limits  ETHANOL - Abnormal; Notable for the following components:   Alcohol, Ethyl (B) 29 (*)    All other components within normal limits  CBC WITH DIFFERENTIAL/PLATELET - Abnormal; Notable for the following components:   RDW 15.7 (*)    Eosinophils Absolute 0.9 (*)    All other components within normal limits  RAPID URINE DRUG SCREEN, HOSP PERFORMED  TROPONIN I (HIGH SENSITIVITY)  TROPONIN I (HIGH SENSITIVITY)    EKG EKG Interpretation  Date/Time:  Thursday March 03 2020 22:20:45 EDT Ventricular Rate:  83 PR Interval:    QRS Duration: 85 QT Interval:  355 QTC Calculation: 418 R Axis:   57 Text Interpretation: Sinus rhythm Confirmed by Benjiman Core [phone]) on 03/03/2020 10:32:02 PM   Radiology CT Head Wo Contrast  Result Date: 03/03/2020 CLINICAL DATA:  Recent seizure activity EXAM: CT HEAD WITHOUT CONTRAST TECHNIQUE: Contiguous axial images were obtained from the base of the skull through the vertex without intravenous contrast. COMPARISON:  09/16/2014 FINDINGS: Brain: No evidence of acute infarction, hemorrhage, hydrocephalus, extra-axial collection or mass lesion/mass effect. Vascular: No hyperdense vessel or unexpected calcification. Skull: Normal. Negative for fracture or focal lesion. Sinuses/Orbits: No acute finding. Other: None. IMPRESSION: Normal head CT for age. Electronically Signed   By: Alcide Clever M.D.   On: 03/03/2020 22:06    Procedures Procedures (including critical care time)  Medications Ordered in ED Medications  LORazepam (ATIVAN) injection 1 mg (has no administration in time range)    ED Course  I have reviewed the triage vital signs and the nursing notes.  Pertinent labs & imaging results that were available during my care of the patient were  reviewed by me and considered in my medical decision making (see chart for details).    MDM Rules/Calculators/A&P                          Patient with likely seizure.  Patient does not really remember what happened.  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  Has not followed up with neurology for this however.  Patient has been drinking alcohol overall heavily but less heavily recently.  Alcohol level only mildly elevated.  Patient appears back at his baseline now.  Head CT reassuring.  Does  have bites on his tongue.  Lab work overall reassuring.  I think that patient appears cleared for discharge.  Will give IV Ativan to help bridge through this higher risk window.  Also will give Librium taper.  Discharge home with neurology follow-up.  Instructed not to drive.  Also given resources for alcohol treatment since patient states he is going to quit drinking. 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

## 2020-03-03 NOTE — ED Triage Notes (Signed)
Pt reports waking up in an ambulance. Pt denied EMS care. Says he does not know why he is here and to ask his friend. Pt admits to biting tongue and having sore muscles/ cramps. Pt ambulatory. Denies hx of seizures.

## 2020-06-15 ENCOUNTER — Emergency Department (HOSPITAL_COMMUNITY)
Admission: EM | Admit: 2020-06-15 | Discharge: 2020-06-15 | Disposition: A | Discharge status: Home / Self Care | Payer: Self-pay | Attending: Emergency Medicine | Admitting: Emergency Medicine

## 2020-06-15 ENCOUNTER — Emergency Department (HOSPITAL_COMMUNITY): Payer: Self-pay

## 2020-06-15 ENCOUNTER — Encounter (HOSPITAL_COMMUNITY): Payer: Self-pay

## 2020-06-15 DIAGNOSIS — F17210 Nicotine dependence, cigarettes, uncomplicated: Secondary | ICD-10-CM | POA: Insufficient documentation

## 2020-06-15 DIAGNOSIS — B349 Viral infection, unspecified: Secondary | ICD-10-CM | POA: Insufficient documentation

## 2020-06-15 DIAGNOSIS — F1010 Alcohol abuse, uncomplicated: Secondary | ICD-10-CM | POA: Insufficient documentation

## 2020-06-15 DIAGNOSIS — R000 Tachycardia, unspecified: Secondary | ICD-10-CM | POA: Insufficient documentation

## 2020-06-15 DIAGNOSIS — Z20822 Contact with and (suspected) exposure to covid-19: Secondary | ICD-10-CM | POA: Insufficient documentation

## 2020-06-15 LAB — GROUP A STREP BY PCR: Group A Strep by PCR: NOT DETECTED

## 2020-06-15 MED ORDER — DEXAMETHASONE SODIUM PHOSPHATE 10 MG/ML IJ SOLN
10.0000 mg | Freq: Once | INTRAMUSCULAR | Status: AC
  Administered 2020-06-15: 10 mg via INTRAMUSCULAR
  Filled 2020-06-15: qty 1

## 2020-06-15 MED ORDER — LORAZEPAM 1 MG PO TABS
1.0000 mg | ORAL_TABLET | Freq: Once | ORAL | Status: AC
  Administered 2020-06-15: 1 mg via ORAL
  Filled 2020-06-15: qty 1

## 2020-06-15 NOTE — ED Provider Notes (Signed)
[facility]-EMERGENCY DEPT Provider Note   CSN: [id number] Arrival date & time: 06/15/20  1433     History Chief Complaint  Patient presents with  . Chest Pain  . Nasal Congestion    Brett Bradley is a 35 y.o. male who presents to the ED today with complaints of URI like symptoms. Pt endorses that yesterday after getting off of work he began having a gradual onset, constant, achy, sore throat. He also complains of a cough, chest tightness, shortness of breath mostly when laying flat, nasal congestion, wheezing, headache. He denies any recent sick contacts however is unvaccinated against COVID 19. He has not been taking anything for his symptoms. He does not believe he has had any fevers however states he has been chilly at times. Denies vision changes, neck stiffness, rash, diaphoresis, nausea, vomiting, diarrhea, body aches, abdominal pain, or any other associated symptoms.   The history is provided by the patient and medical records.       Past Medical History:  Diagnosis Date  . Alcohol abuse   . Wrist fracture    right    There are no problems to display for this patient.   History reviewed. No pertinent surgical history.     Family History  Problem Relation Age of Onset  . Prostate cancer Paternal Grandfather     Social History   Tobacco Use  . Smoking status: Current Some Day Smoker    Packs/day: 0.50    Types: Cigarettes  . Smokeless tobacco: Never Used  Vaping Use  . Vaping Use: Never used  Substance Use Topics  . Alcohol use: Yes    Comment: daily  . Drug use: Yes    Types: Marijuana    Home Medications Prior to Admission medications   Medication Sig Start Date End Date Taking? Authorizing Provider  azithromycin (ZITHROMAX Z-PAK) 250 MG tablet Take 1 tablet (250 mg total) by mouth daily. Take 2 tabs for first dose, then 1 tab for each additional dose Patient not taking: Reported on 03/03/2020 12/31/16   Azalia Bilis, MD   chlordiazePOXIDE (LIBRIUM) 25 MG capsule 50mg  PO TID x 1D, then 25-50mg  PO BID X 1D, then 25-50mg  PO QD X 1D 03/03/20   13/4/21, MD  cyclobenzaprine (FLEXERIL) 10 MG tablet Take 1 tablet (10 mg total) by mouth 2 (two) times daily as needed for muscle spasms (right flank muscle spasms). Patient not taking: Reported on 03/03/2020 09/30/19   Tegeler, 11/30/19, MD  ondansetron (ZOFRAN) 4 MG tablet Take 1 tablet (4 mg total) by mouth every 8 (eight) hours as needed for nausea or vomiting. Patient not taking: Reported on 03/03/2020 01/02/17   03/04/17, MD    Allergies    Patient has no known allergies.  Review of Systems   Review of Systems  Constitutional: Positive for chills and fatigue. Negative for fever.  HENT: Positive for sore throat. Negative for drooling, trouble swallowing and voice change.   Eyes: Negative for visual disturbance.  Respiratory: Positive for cough, chest tightness, shortness of breath and wheezing.   Cardiovascular: Negative for palpitations and leg swelling.  Gastrointestinal: Negative for abdominal pain, diarrhea, nausea and vomiting.  Musculoskeletal: Negative for neck pain and neck stiffness.  Skin: Negative for rash.  Neurological: Positive for headaches.  All other systems reviewed and are negative.   Physical Exam Updated Vital Signs BP (!) 144/117 (BP Location: Left Arm)   Pulse (!) 106   Temp 98.4 F (  36.9 C) (Oral)   Resp 18   SpO2 98%   Physical Exam Vitals and nursing note reviewed.  Constitutional:      Appearance: He is not ill-appearing or diaphoretic.  HENT:     Head: Normocephalic and atraumatic.     Comments: Phonating normally. No trismus. No drooling.     Nose: Congestion present.     Mouth/Throat:     Mouth: Mucous membranes are moist.     Pharynx: Uvula midline. Posterior oropharyngeal erythema present.     Tonsils: Tonsillar exudate present. 2+ on the right. 2+ on the left.  Eyes:     Conjunctiva/sclera:  Conjunctivae normal.  Cardiovascular:     Rate and Rhythm: Regular rhythm. Tachycardia present.     Pulses:          Radial pulses are 2+ on the right side and 2+ on the left side.       Dorsalis pedis pulses are 2+ on the right side and 2+ on the left side.  Pulmonary:     Effort: Pulmonary effort is normal.     Breath sounds: Normal breath sounds. No decreased breath sounds, wheezing, rhonchi or rales.     Comments: Speaking in full sentences without difficulty. Satting 98% on RA. LCTAB.  Chest:     Chest wall: No tenderness.  Abdominal:     Palpations: Abdomen is soft.     Tenderness: There is no abdominal tenderness. There is no guarding or rebound.  Musculoskeletal:     Cervical back: Neck supple.  Lymphadenopathy:     Cervical: Cervical adenopathy (bilateral anterior) present.  Skin:    General: Skin is warm and dry.  Neurological:     Mental Status: He is alert.     ED Results / Procedures / Treatments   Labs (all labs ordered are listed, but only abnormal results are displayed) Labs Reviewed  GROUP A STREP BY PCR  SARS CORONAVIRUS 2 (TAT 6-24 HRS)    EKG None  Radiology DG Chest 2 View  Result Date: 06/15/2020 CLINICAL DATA:  Chest pain, sore throat, nasal congestion EXAM: CHEST - 2 VIEW COMPARISON:  12/31/2016 FINDINGS: The heart size and mediastinal contours are within normal limits. Both lungs are clear. The visualized skeletal structures are unremarkable. IMPRESSION: No active cardiopulmonary disease. Electronically Signed   By: Sharlet SalinaMichael  Brown M.D.   On: 06/15/2020 15:27    Procedures Procedures   Medications Ordered in ED Medications  dexamethasone (DECADRON) injection 10 mg (10 mg Intramuscular Given 06/15/20 1553)  LORazepam (ATIVAN) tablet 1 mg (1 mg Oral Given 06/15/20 1639)    ED Course  I have reviewed the triage vital signs and the nursing notes.  Pertinent labs & imaging results that were available during my care of the patient were reviewed  by me and considered in my medical decision making (see chart for details).    MDM Rules/Calculators/A&P                          35 year old male presenting to the ED today with URI like complaints including nasal congestion, sore throat, headache, chills, fatigue, chest tightness, cough, SOB x 1 day. Pt is unvaccinated. On arrival to the ED pt is afebrile at 98.4; tachycardic at 106; nontachypneic. Satting 98% on RA. Blood pressure elevated 144/117; does not appear that pt is on antihypertensives. On exam pt is nontoxic appearing. He does have 2+ bilateral tonsilar hypertrophy with exudate;  uvula is midline. He is tolerating his own secretions and phonating normally. No concern for deep space infection of the neck including PTA. Will plan to swab for strep however. Will also swab for COVID given he is unvaccinated with a constellation of symptoms consistent with COVID. Pt had an EKG done prior to being seen due to complaint of chest pain - sinus tachycardia; no acute ischemic changes. CXR also ordered prior to being seen and is pending. Suspect viral illness at this time. Do not feel pt needs additional work up today. He does have history of alcohol abuse - last drank last night. Pt reports he does not feel like he is going through withdrawals at this time. Will obtain CIWA score at this time.   CIWA score of 5. 1 mg PO ativan provided.   Strep test negative CXR clear   Repeat CIWA of 1. Pt is not interested in quitting alcohol at this time and therefore was not discharged with librium. Pt continues to be tachycardic however I suspect this is secondary to viral illness/possibility of COVID 19. He does not appear dry on exam; no vomiting or diarrhea to suggest dehydration/need for IVF. Pt denies outright chest pain however states it feels tight - I have very low suspicion for PE today given the constellation of symptoms he is experiencing. Will discharge home at this time with PCP follow up and  instructions to self isolate until he receives COVID results. Strict return precautions discussed. Pt in agreement with plan and stable for discharge.   This note was prepared using Dragon voice recognition software and may include unintentional dictation errors due to the inherent limitations of voice recognition software.  Brett Bradley was evaluated in Emergency Department on 06/15/2020 for the symptoms described in the history of present illness. He was evaluated in the context of the global COVID-19 pandemic, which necessitated consideration that the patient might be at risk for infection with the SARS-CoV-2 virus that causes COVID-19. Institutional protocols and algorithms that pertain to the evaluation of patients at risk for COVID-19 are in a state of rapid change based on information released by regulatory bodies including the CDC and federal and state organizations. These policies and algorithms were followed during the patient's care in the ED.  Final Clinical Impression(s) / ED Diagnoses Final diagnoses:  Viral illness  Person under investigation for COVID-19  Alcohol abuse    Rx / DC Orders ED Discharge Orders    None       Discharge Instructions     Your strep test was negative and your chest xray did not show any acute findings at this time. We have swabbed you for COVID - the test will result tomorrow. Please self isolate and await your results at home. We will call you if you test positive. You can also log into MyChart and check the results that way. If positive you will need to self isolate for 5 days.   Continue taking OTC medications as needed including DayQuil/NyQuil, tylenol, Ibuprofen for symptomatic relief. Drink plenty of fluids to stay hydrated.   Follow up with your PCP regarding your ED visit. If you do not have one you can follow up with [facility] for primary care needs.   Return to the ED IMMEDIATELY for any worsening symptoms including  worsening shortness of breath, severe chest pain, passing out, lips/fingers turning blue, inability to awaken easily, new onset confusion, or any other new/concerning symptoms.  Tanda Rockers, PA-C 06/15/20 1816    Bethann Berkshire, MD 06/15/20 2154

## 2020-06-15 NOTE — ED Triage Notes (Signed)
Pt presents with c/o chest pain and nasal congestion. Pt also c/o sore throat. Pt reports all symptoms present since last night.

## 2020-06-15 NOTE — Discharge Instructions (Signed)
Your strep test was negative and your chest xray did not show any acute findings at this time. We have swabbed you for COVID - the test will result tomorrow. Please self isolate and await your results at home. We will call you if you test positive. You can also log into MyChart and check the results that way. If positive you will need to self isolate for 5 days.   Continue taking OTC medications as needed including DayQuil/NyQuil, tylenol, Ibuprofen for symptomatic relief. Drink plenty of fluids to stay hydrated.   Follow up with your PCP regarding your ED visit. If you do not have one you can follow up with [facility] and Wellness for primary care needs.   Return to the ED IMMEDIATELY for any worsening symptoms including worsening shortness of breath, severe chest pain, passing out, lips/fingers turning blue, inability to awaken easily, new onset confusion, or any other new/concerning symptoms.

## 2020-06-16 LAB — SARS CORONAVIRUS 2 (TAT 6-24 HRS): SARS Coronavirus 2: NEGATIVE
